# Patient Record
Sex: Female | Born: 1975 | Race: Black or African American | Hispanic: No | Smoking: Never smoker
Health system: Southern US, Community
[De-identification: ages and names within clinical notes are randomized; demographics above are authoritative.]

## PROBLEM LIST (undated history)

## (undated) DIAGNOSIS — M419 Scoliosis, unspecified: Secondary | ICD-10-CM

## (undated) DIAGNOSIS — M797 Fibromyalgia: Secondary | ICD-10-CM

## (undated) DIAGNOSIS — F32A Depression, unspecified: Secondary | ICD-10-CM

## (undated) DIAGNOSIS — M549 Dorsalgia, unspecified: Secondary | ICD-10-CM

## (undated) DIAGNOSIS — F419 Anxiety disorder, unspecified: Secondary | ICD-10-CM

## (undated) DIAGNOSIS — F329 Major depressive disorder, single episode, unspecified: Secondary | ICD-10-CM

## (undated) HISTORY — PX: ABDOMINAL HYSTERECTOMY: SHX81

## (undated) HISTORY — PX: ECTOPIC PREGNANCY SURGERY: SHX613

---

## 2013-08-07 ENCOUNTER — Encounter (HOSPITAL_COMMUNITY): Payer: Self-pay | Admitting: Emergency Medicine

## 2013-08-07 ENCOUNTER — Emergency Department (HOSPITAL_COMMUNITY)
Admission: EM | Admit: 2013-08-07 | Discharge: 2013-08-07 | Disposition: A | Discharge status: Home / Self Care | Payer: Medicaid - Out of State | Attending: Emergency Medicine | Admitting: Emergency Medicine

## 2013-08-07 DIAGNOSIS — H6690 Otitis media, unspecified, unspecified ear: Secondary | ICD-10-CM | POA: Insufficient documentation

## 2013-08-07 DIAGNOSIS — R51 Headache: Secondary | ICD-10-CM | POA: Insufficient documentation

## 2013-08-07 DIAGNOSIS — F419 Anxiety disorder, unspecified: Secondary | ICD-10-CM

## 2013-08-07 DIAGNOSIS — Z8739 Personal history of other diseases of the musculoskeletal system and connective tissue: Secondary | ICD-10-CM | POA: Insufficient documentation

## 2013-08-07 DIAGNOSIS — F411 Generalized anxiety disorder: Secondary | ICD-10-CM | POA: Insufficient documentation

## 2013-08-07 HISTORY — DX: Fibromyalgia: M79.7

## 2013-08-07 HISTORY — DX: Scoliosis, unspecified: M41.9

## 2013-08-07 MED ORDER — LORAZEPAM 1 MG PO TABS: 1.0000 mg | ORAL_TABLET | Freq: Three times a day (TID) | ORAL | Status: DC | PRN

## 2013-08-07 MED ORDER — AMOXICILLIN 500 MG PO CAPS: 500.0000 mg | ORAL_CAPSULE | Freq: Three times a day (TID) | ORAL | Status: DC

## 2013-08-07 MED ORDER — TRAMADOL HCL 50 MG PO TABS: 50.0000 mg | ORAL_TABLET | Freq: Four times a day (QID) | ORAL | Status: DC | PRN

## 2013-08-07 NOTE — Discharge Instructions (Signed)
Take amoxicillin as directed until gone. Take Tramadol as needed for pain. Take Ativan as needed for anxiety. Follow up with a primary care provider from the resource guide below.    Emergency Department Resource Guide 1) Find a Doctor and Pay Out of Pocket Although you won't have to find out who is covered by your insurance plan, it is a good idea to ask around and get recommendations. You will then need to call the office and see if the doctor you have chosen will accept you as a new patient and what types of options they offer for patients who are self-pay. Some doctors offer discounts or will set up payment plans for their patients who do not have insurance, but you will need to ask so you aren't surprised when you get to your appointment.  2) Contact Your Local Health Department Not all health departments have doctors that can see patients for sick visits, but many do, so it is worth a call to see if yours does. If you don't know where your local health department is, you can check in your phone book. The CDC also has a tool to help you locate your state's health department, and many state websites also have listings of all of their local health departments.  3) Find a Walk-in Clinic If your illness is not likely to be very severe or complicated, you may want to try a walk in clinic. These are popping up all over the country in pharmacies, drugstores, and shopping centers. They're usually staffed by nurse practitioners or physician assistants that have been trained to treat common illnesses and complaints. They're usually fairly quick and inexpensive. However, if you have serious medical issues or chronic medical problems, these are probably not your best option.  No Primary Care Doctor: - Call Health Connect at  [phone] - they can help you locate a primary care doctor that  accepts your insurance, provides certain services, etc. - Physician Referral Service- [phone]  Chronic Pain  Problems: Organization         Address  Phone   Notes  [facility]  [phone] Patients need to be referred by their primary care doctor.   Medication Assistance: Organization         Address  Phone   Notes  [facility] [address] [place], [place] [address] [phone]) [phone] --Must be a resident of [facility] [place] -- Must have NO insurance coverage whatsoever (no Medicaid/ Medicare, etc.) -- The pt. MUST have a primary care doctor that directs their care regularly and follows them in the community   MedAssist  [phone]) [phone]   [employer]  [phone]    Agencies that provide inexpensive medical care: Organization         Address  Phone   Notes  [facility]  [phone]   [facility]    [phone]   [facility] [address] [place], [place] [address] [phone]   [facility] [address], [place] [phone]   [facility]anned Parenthood    [phone]   [facility]    [phone]   [facility]  [address], [place] Phone:  [phone], Fax:  [phone] Hours of Operation:  9 am - 6 pm, M-F.  Also accepts Medicaid/Medicare and self-pay.  [facility]  [address], [place] Phone: [phone], Fax: [phone]. Hours of Operation:  8:30 am - 5:30 pm, M-F.  Also accepts Medicaid and self-pay.  °[facility] [place] [address], [place] Phone: [phone]   °[facility] [address], [place], [place] [phone], Ext. 123 Mondays & Thursdays: 7-9 AM.  First 15 patients are seen on a first come, first serve basis. °  ° °Medicaid-accepting [place] Providers: ° °Organization         Address  Phone   Notes  °[facility] [address], [place] [phone] Also  accepts self-pay patients.  °[facility] [address], [place] ° [phone]) [phone]   °[facility] [address], [place] [phone]) [phone]   °[facility] [address] [place] [address], [place] [phone]) [phone]   °Veita Bland [address], [place]  ° [phone]) [phone] Only accepts [place] Access Medicaid patients after they have their name applied to their card.  ° °Self-Pay (no insurance) in [place]: ° °Organization         Address  Phone   Notes  °Sickle Cell Patients, [facility] [address], [place] [phone]   °[facility] [address], [place] [phone]) [phone]   °[facility] ° [address], [place] [phone]) [phone]   °[facility]/Dr. Osei-Bonsu ° [address] [place] [address], [place] or [address], [place] [phone] Phone number for both [place] and [place] locations is the same.  °[facility] [address], [place] [phone]) [phone]   °[facility] [address] [place] [address], [place] or [address] [phone]) [phone] °([phone]) [phone]   °[facility] [address], [place] ([phone]) [phone], phone; [phone], fax Sees patients 1st and 3rd Saturday of every month.  Must not qualify for public or private insurance (i.e. Medicaid, Medicare, [place] Health Choice, Veterans' Benefits) • Household income should be no more than 200% of the poverty level •The clinic cannot treat you if you are pregnant or think you are pregnant • Sexually transmitted diseases are not treated at the clinic.  ° ° °Dental Care: °Organization         Address  Phone  Notes  °[place] [facility] [address], [place] [phone]) [phone] Accepts children up to age 21 who are enrolled in Medicaid or [place] Health Choice; pregnant  women with a Medicaid card; and children who have applied for Medicaid or [place] Health Choice, but were declined, whose parents can pay a reduced fee at time of service.  °[place] [facility] [place]  [address], [place] [phone]) [phone] Accepts children up to age 21 who are enrolled in Medicaid or [place] Health Choice; pregnant women with a Medicaid card; and children who have applied for Medicaid or [place] Health Choice, but were declined, whose parents can pay a reduced fee at time of service.  °[facility] ° [address], [place] [phone]) [phone] Patients are seen by appointment only. Walk-ins are not accepted. [facility] will see patients 18 years of age and older. °Monday - Tuesday (8am-5pm) °Most Wednesdays (8:30-5pm) °$30 per visit, cash only  °[facility]   [facility]  [address], [facility] [phone] Patients are seen by appointment only. Walk-ins are not accepted. [place] will see patients 37 years of age and older. One Wednesday Evening (Monthly: Volunteer Based).  $30 per visit, cash only  [place]  [phone] for adults; Children under age 77, call Graduate Pediatric Dentistry at [phone]. Children aged 79-14, please call [phone] to request a pediatric application.  Dental services are provided in all areas of dental care including fillings, crowns and bridges, complete and partial dentures, implants, gum treatment, root canals, and extractions. Preventive care is also provided. Treatment is provided to both adults and children. Patients are selected via a lottery and there is often a waiting list.   [facility] [address], [place]  [phone] [email or web address]   [facility] [address] [place], [place] [phone], Ext. 123 Second and Fourth Thursday of each month, opens at 6:30 AM; Clinic ends at 9 AM.  Patients are  seen on a first-come first-served basis, and a limited number are seen during each clinic.   [facility]  [address] [place]ard D[place]er [place], [place] [phone]   Eligibility Requirements You must have lived in [place], [place], or [place] counties for at least the last three months.   You cannot be eligible for state or federal sponsored [employer], including [employer], [place], or [employer].   You generally cannot be eligible for healthcare insurance through your employer.    How to apply: Eligibility screenings are held every Tuesday and Wednesday afternoon from 1:00 pm until 4:00 pm. You do not need an appointment for the interview!  [facility] [address], [place], [place]   [place]  [place] Department  [place]  [phone]    Behavioral Health Resources in the Community: Intensive Outpatient Programs Organization         Address  Phone  Notes  [place]. [address] [place], [place] [phone]   [facility] [address], [place], [place]   [facility] [address] [place], [place]   [place] [address],  [place], [place] or [phone]   Substance Abuse Resources Organization         Address  Phone  Notes  [facility]  [phone]   [employer]  [phone]) [phone]   [facility] [place]   Chinita Pester  [phone]   Residential & Outpatient Substance Abuse Program  [phone]   Psychological Services Organization         Address  Phone  Notes  [facility] [place]  [place]  [phone]   [place] [address] [place] [phone] or [phone]    Mobile Crisis  Teams Organization         Address  Phone  Notes  [facility], Mobile Crisis Care Unit  [phone]   [facility]  [address] [place], [place]   [place] [address], [place] [phone]    Self-Help/Support Groups Organization         Address  Phone             Notes  Mental Health  [place] - variety of support groups  336- H3156881 Call for more information  Narcotics Anonymous (NA), [facility] [address], [employer] [place]  2 meetings at this location   Special educational needs teacher         Address  Phone  Notes  [facility] [place],    [place]  [phone])[phone]   [facility]  [address], [place] [address], [place], [place]   [place], [place] [phone] Admissions: 8am-3pm M-F  Incentives Substance [place] [address],    [place], [place] [phone]   [facility] [address], [place], [place]   [facility] [address],  [place], [place]   [facility] - Intensive Outpatient [address], [place], [place]   [facility]  [place], [place] [phone]-(820)[phone] or [phone]   [facility] ([facility]) [address] [place], [place] Accepts Medicaid  [facility] [address],  [place] [phone] Substance Abuse/Addiction Treatment   [facility] Organization         Address  Phone  Notes  [facility]  [phone]   Domenic Schwab, PhD [address] [place], [place]   [phone] or 7720305616   [place], [place] [phone]   [facility] [address], [place], [place] [phone]  Insurance/Medicaid/sponsorship through [facility] Families [address], [address] [place]                                    [place], [place] [phone] [employer] [address], [place] [phone]    Dr. Adele Schilder  [phone]   [facility]. 1) [address] [place] 2) [place] 3)  [place] [address], [place] [phone]  [phone]   [place] [phone] or [phone] (After Hours)

## 2013-08-07 NOTE — ED Notes (Signed)
Pt is here with left ear pain and drainage

## 2013-08-07 NOTE — ED Provider Notes (Signed)
CSN: [id number]     Arrival date & time 08/07/13  1007 History  This chart was scribed for non-physician practitioner, Emilia BeckKaitlyn Melana Hingle, PA-C working with Gavin PoundMichael Y. Oletta LamasGhim, MD by Greggory StallionKayla Andersen, ED scribe. This patient was seen in room TR05C/TR05C and the patient's care was started at 11:32 AM.    Chief Complaint  Patient presents with  . Otalgia   The history is provided by the patient. No language interpreter was used.   HPI Comments: Katelyn Wade is a 38 y.o. female who presents to the Emergency Department complaining of gradual onset, constant, aching left ear pain that started about one week ago. She states the pain has caused her to have a headache and she has subjective fever. Pt has put peroxide and olive oil in her ear with temporary relief. Denies ear drainage, sore throat. [place] from [place] and is having increased anxiety due to social stressors. She is requesting something for anxiety.   Past Medical History  Diagnosis Date  . Scoliosis   . Fibromyalgia    Past Surgical History  Procedure Laterality Date  . Abdominal hysterectomy     No family history on file. History  Substance Use Topics  . Smoking status: Never Smoker   . Smokeless tobacco: Not on file  . Alcohol Use: No   OB History   Grav Para Term Preterm Abortions TAB SAB Ect Mult Living                 Review of Systems  Constitutional: Positive for fever.  HENT: Positive for ear pain. Negative for ear discharge and sore throat.   Neurological: Positive for headaches.  All other systems reviewed and are negative.   Allergies  Review of patient's allergies indicates no known allergies.  Home Medications  No current outpatient prescriptions on file.  BP 120/57  Pulse 65  Temp(Src) 98.6 F (37 C) (Oral)  Resp 18  SpO2 98%  Physical Exam  Nursing note and vitals reviewed. Constitutional: She is oriented to person, place, and time. She appears well-developed and well-nourished.  No distress.  HENT:  Head: Normocephalic and atraumatic.  Right Ear: Tympanic membrane, external ear and ear canal normal.  Left Ear: Tympanic membrane, external ear and ear canal normal.  Left mastoid tenderness to palpation.   Eyes: EOM are normal.  Neck: Neck supple. No tracheal deviation present.  Cardiovascular: Normal rate.   Pulmonary/Chest: Effort normal. No respiratory distress.  Musculoskeletal: Normal range of motion.  Neurological: She is alert and oriented to person, place, and time.  Skin: Skin is warm and dry.  Psychiatric: She has a normal mood and affect. Her behavior is normal.    ED Course  Procedures (including critical care time)  DIAGNOSTIC STUDIES: Oxygen Saturation is 98% on RA, normal by my interpretation.    COORDINATION OF CARE: 11:35 AM-Discussed treatment plan which includes amoxicillin and PCP resources with pt at bedside and pt agreed to plan.   Labs Review Labs Reviewed - No data to display Imaging Review No results found.  EKG Interpretation   None       MDM   Final diagnoses:  Otitis media, acute  Anxiety    Patient likely has otitis media and will be treated with amoxicillin. Patient will have tramadol for pain. Patient will have ativan for anxiety. Vitals stable and patient afebrile.   I personally performed the services described in this documentation, which was scribed in my presence. The recorded information has  been reviewed and is accurate.   Emilia Beck, [place] 08/07/13 [phone]

## 2013-08-07 NOTE — ED Notes (Signed)
Left ear pain x 2 days. States is starting to hurt down her neck.

## 2013-08-16 NOTE — ED Provider Notes (Signed)
Medical screening examination/treatment/procedure(s) were performed by non-physician practitioner and as supervising physician I was immediately available for consultation/collaboration.   Merissa Renwick Y. Zacherie Honeyman, MD 08/16/13 0908 

## 2013-09-19 ENCOUNTER — Encounter (HOSPITAL_COMMUNITY): Payer: Self-pay | Admitting: Emergency Medicine

## 2013-09-19 ENCOUNTER — Emergency Department (INDEPENDENT_AMBULATORY_CARE_PROVIDER_SITE_OTHER)
Admission: EM | Admit: 2013-09-19 | Discharge: 2013-09-19 | Disposition: A | Discharge status: Home / Self Care | Payer: Medicaid Other | Source: Home / Self Care | Attending: Emergency Medicine | Admitting: Emergency Medicine

## 2013-09-19 DIAGNOSIS — L259 Unspecified contact dermatitis, unspecified cause: Secondary | ICD-10-CM

## 2013-09-19 DIAGNOSIS — L309 Dermatitis, unspecified: Secondary | ICD-10-CM

## 2013-09-19 HISTORY — DX: Dorsalgia, unspecified: M54.9

## 2013-09-19 HISTORY — DX: Anxiety disorder, unspecified: F41.9

## 2013-09-19 HISTORY — DX: Major depressive disorder, single episode, unspecified: F32.9

## 2013-09-19 HISTORY — DX: Depression, unspecified: F32.A

## 2013-09-19 LAB — HIV ANTIBODY (ROUTINE TESTING W REFLEX): HIV: NONREACTIVE

## 2013-09-19 LAB — RPR: RPR Ser Ql: NONREACTIVE

## 2013-09-19 MED ORDER — PREDNISONE 20 MG PO TABS: ORAL_TABLET | ORAL | Status: DC
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
## 2013-09-19 NOTE — Discharge Instructions (Signed)
Differential diagnosis: erythema multiforme, psoriasis, pityriasis rosea, syphilis.  Only a skin biopsy can tell the difference, so important to see a dermatologist.  Use mild soap, use warm water to shower, take brief shower, apply moisturizer to skin after showering.

## 2013-09-19 NOTE — ED Notes (Signed)
Patient has a rash covering trunk and extremities.  Flat, dry, red areas of different diameters, areas itch.

## 2013-09-20 ENCOUNTER — Telehealth (HOSPITAL_COMMUNITY): Payer: Self-pay | Admitting: *Deleted

## 2013-09-20 NOTE — ED Notes (Signed)
Pt. called in for her lab results. Pt. verified x 2 and told that HIV and RPR were non-reactive.  Pt. instructed if she had an exposure to get her HIV rechecked in 6 mos.  She asked me, what is an exposure? I told her it means she had unprotected sex with someone.  Pt. voiced understanding. Vassie [place], Demarri Elie M 09/20/2013

## 2013-09-21 NOTE — Progress Notes (Signed)
Quick Note:  Test result was normal. No further action is needed at this time. The patient was called and informed of these normal results. ______

## 2013-10-11 ENCOUNTER — Ambulatory Visit: Payer: Medicaid Other | Attending: Internal Medicine | Admitting: Internal Medicine

## 2013-10-11 VITALS — BP 128/98 | HR 98 | Temp 98.0°F | Resp 16 | Ht 63.0 in | Wt 175.0 lb

## 2013-10-11 DIAGNOSIS — M549 Dorsalgia, unspecified: Secondary | ICD-10-CM | POA: Insufficient documentation

## 2013-10-11 DIAGNOSIS — G894 Chronic pain syndrome: Secondary | ICD-10-CM

## 2013-10-11 DIAGNOSIS — M542 Cervicalgia: Secondary | ICD-10-CM | POA: Insufficient documentation

## 2013-10-11 DIAGNOSIS — G8929 Other chronic pain: Secondary | ICD-10-CM | POA: Insufficient documentation

## 2013-10-11 MED ORDER — OXYCODONE HCL 15 MG PO TABS: 15.0000 mg | ORAL_TABLET | Freq: Four times a day (QID) | ORAL | Status: DC | PRN

## 2013-10-11 MED ORDER — ALPRAZOLAM 2 MG PO TABS: 2.0000 mg | ORAL_TABLET | Freq: Four times a day (QID) | ORAL | Status: DC | PRN

## 2013-10-11 NOTE — Progress Notes (Signed)
Pt is here to establish care. Pt is requesting referrals to dermatology, pain management and psychology. Pt reports having scoliosis w/ extreme pain.
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
## 2013-10-18 ENCOUNTER — Ambulatory Visit: Payer: Medicaid Other | Attending: Internal Medicine | Admitting: *Deleted

## 2013-10-18 DIAGNOSIS — F4323 Adjustment disorder with mixed anxiety and depressed mood: Secondary | ICD-10-CM
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
## 2013-10-23 ENCOUNTER — Encounter: Payer: Self-pay | Admitting: Internal Medicine

## 2013-10-23 ENCOUNTER — Ambulatory Visit: Payer: Medicaid Other | Attending: Internal Medicine | Admitting: Internal Medicine

## 2013-10-23 VITALS — BP 100/67 | HR 75 | Temp 98.7°F | Resp 16 | Ht 63.0 in | Wt 176.0 lb

## 2013-10-23 DIAGNOSIS — R21 Rash and other nonspecific skin eruption: Secondary | ICD-10-CM | POA: Diagnosis not present

## 2013-10-23 DIAGNOSIS — F329 Major depressive disorder, single episode, unspecified: Secondary | ICD-10-CM | POA: Insufficient documentation

## 2013-10-23 DIAGNOSIS — F3289 Other specified depressive episodes: Secondary | ICD-10-CM | POA: Insufficient documentation

## 2013-10-23 DIAGNOSIS — G4700 Insomnia, unspecified: Secondary | ICD-10-CM | POA: Insufficient documentation

## 2013-10-23 DIAGNOSIS — IMO0002 Reserved for concepts with insufficient information to code with codable children: Secondary | ICD-10-CM | POA: Insufficient documentation

## 2013-10-23 DIAGNOSIS — M2550 Pain in unspecified joint: Secondary | ICD-10-CM | POA: Insufficient documentation

## 2013-10-23 MED ORDER — TRIAMCINOLONE ACETONIDE 0.1 % EX CREA: 1.0000 "application " | TOPICAL_CREAM | Freq: Two times a day (BID) | CUTANEOUS | Status: DC

## 2013-10-23 MED ORDER — HYDROXYZINE PAMOATE 25 MG PO CAPS: 25.0000 mg | ORAL_CAPSULE | Freq: Three times a day (TID) | ORAL | Status: DC | PRN
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
## 2013-10-23 NOTE — Progress Notes (Signed)
Pt comes in with ongoing rash noted all over body that started 09/05/13. Pt was seen at urgent care and prescribed Steroids. States not effective. Dry patches seen on chest,arms and thighs, itchiness and irritation worsening at night. Negative blood work up noted. Pt has never tried Benadryl for relief.

## 2013-10-24 ENCOUNTER — Telehealth: Payer: Self-pay | Admitting: Emergency Medicine

## 2013-10-24 LAB — ANA: ANA: NEGATIVE

## 2013-10-24 LAB — SEDIMENTATION RATE: Sed Rate: 6 mm/hr (ref 0–22)

## 2013-10-24 LAB — C-REACTIVE PROTEIN: CRP: <0.5 mg/dL (ref <0.60)

## 2013-10-24 LAB — RHEUMATOID FACTOR

## 2013-10-24 NOTE — Telephone Encounter (Signed)
Message copied by Darlis LoanSMITH, JILL D on Tue Oct 24, 2013  4:09 PM ------      Message from: [place] CommonsKECK, VALERIE A      Created: Tue Oct 24, 2013  2:29 PM       Please call patient and let her know everything came back normal. It is not a autoimmune disorder. Thanks ------

## 2013-10-24 NOTE — Telephone Encounter (Signed)
Pt given lab results 

## 2013-11-01 ENCOUNTER — Ambulatory Visit (HOSPITAL_BASED_OUTPATIENT_CLINIC_OR_DEPARTMENT_OTHER): Payer: Medicaid Other | Admitting: Internal Medicine

## 2013-11-01 ENCOUNTER — Ambulatory Visit: Payer: Medicaid Other | Attending: Internal Medicine | Admitting: *Deleted

## 2013-11-01 ENCOUNTER — Encounter: Payer: Self-pay | Admitting: Internal Medicine

## 2013-11-01 VITALS — BP 100/66 | HR 70 | Temp 98.1°F | Resp 14 | Ht 63.0 in | Wt 178.6 lb

## 2013-11-01 DIAGNOSIS — R21 Rash and other nonspecific skin eruption: Secondary | ICD-10-CM | POA: Insufficient documentation

## 2013-11-01 DIAGNOSIS — Z833 Family history of diabetes mellitus: Secondary | ICD-10-CM | POA: Diagnosis not present

## 2013-11-01 DIAGNOSIS — L539 Erythematous condition, unspecified: Secondary | ICD-10-CM | POA: Insufficient documentation

## 2013-11-01 DIAGNOSIS — F4323 Adjustment disorder with mixed anxiety and depressed mood: Secondary | ICD-10-CM

## 2013-11-01 DIAGNOSIS — F3289 Other specified depressive episodes: Secondary | ICD-10-CM | POA: Insufficient documentation

## 2013-11-01 DIAGNOSIS — L299 Pruritus, unspecified: Secondary | ICD-10-CM | POA: Insufficient documentation

## 2013-11-01 DIAGNOSIS — Z0000 Encounter for general adult medical examination without abnormal findings: Secondary | ICD-10-CM | POA: Diagnosis not present

## 2013-11-01 DIAGNOSIS — F411 Generalized anxiety disorder: Secondary | ICD-10-CM | POA: Insufficient documentation

## 2013-11-01 DIAGNOSIS — F329 Major depressive disorder, single episode, unspecified: Secondary | ICD-10-CM | POA: Insufficient documentation

## 2013-11-01 DIAGNOSIS — Z79899 Other long term (current) drug therapy: Secondary | ICD-10-CM | POA: Insufficient documentation

## 2013-11-01 LAB — CBC WITH DIFFERENTIAL/PLATELET
Basophils Absolute: 0 10*3/uL (ref 0.0–0.1)
Basophils Relative: 0 % (ref 0–1)
EOS PCT: 0 % (ref 0–5)
Eosinophils Absolute: 0 10*3/uL (ref 0.0–0.7)
HEMATOCRIT: 42 % (ref 36.0–46.0)
Hemoglobin: 14.3 g/dL (ref 12.0–15.0)
Lymphocytes Relative: 27 % (ref 12–46)
Lymphs Abs: 2.4 10*3/uL (ref 0.7–4.0)
MCH: 30.8 pg (ref 26.0–34.0)
MCHC: 34 g/dL (ref 30.0–36.0)
MCV: 90.3 fL (ref 78.0–100.0)
MONO ABS: 0.4 10*3/uL (ref 0.1–1.0)
Monocytes Relative: 5 % (ref 3–12)
Neutro Abs: 6.1 10*3/uL (ref 1.7–7.7)
Neutrophils Relative %: 68 % (ref 43–77)
PLATELETS: 413 10*3/uL — AB (ref 150–400)
RBC: 4.65 MIL/uL (ref 3.87–5.11)
RDW: 13.4 % (ref 11.5–15.5)
WBC: 8.9 10*3/uL (ref 4.0–10.5)

## 2013-11-01 LAB — POCT GLYCOSYLATED HEMOGLOBIN (HGB A1C): Hemoglobin A1C: 5.2

## 2013-11-01 NOTE — Patient Instructions (Addendum)
No other products on skin for 1 week. Continue to use Kenalog cream

## 2013-11-01 NOTE — Progress Notes (Signed)
Patient is here for F/U from generalized rash. States she has been using 6-8 drops of hydrogen peroxide in water and drinking it. States itching is still present but better. States she has an appt today with Child psychotherapistsocial worker for depression.

## 2013-11-01 NOTE — Progress Notes (Signed)
Patient requested assessment of SA for required documentation for Pain Clinic Referral.  LCSW will follow up with patient for assessment.  Beverly Sessionsywan J Niara Bunker MSW, LCSW Duration 15 minutes

## 2013-11-01 NOTE — Progress Notes (Signed)
Patient ID: Katelyn Wade, female   DOB: 01/18/1976, 38 y.o.   MRN: [id number]  CC: One-week followup for rash  HPI: Patient presents to clinic today for a one-week followup of rash. Patient reports the rash has significantly improved since last visit. Patient reports she is still using her grapeseed oil and other natural Creams on skin to prevent dry skin. Patient reports significant improvement of itching.    No Known Allergies Past Medical History  Diagnosis Date  . Scoliosis   . Fibromyalgia   . Back pain   . Depression   . Anxiety    Current Outpatient Prescriptions on File Prior to Visit  Medication Sig Dispense Refill  . alprazolam (XANAX) 2 MG tablet Take 1 tablet (2 mg total) by mouth 4 (four) times daily as needed for anxiety.  90 tablet  1  . carisoprodol (SOMA) 350 MG tablet Take 350 mg by mouth 4 (four) times daily as needed for muscle spasms.      . DULoxetine (CYMBALTA) 20 MG capsule Take 20 mg by mouth daily.      . hydrOXYzine (VISTARIL) 25 MG capsule Take 1 capsule (25 mg total) by mouth every 8 (eight) hours as needed for itching.  30 capsule  1  . oxyCODONE (ROXICODONE) 15 MG immediate release tablet Take 1 tablet (15 mg total) by mouth every 6 (six) hours as needed for pain.  90 tablet  0  . traMADol (ULTRAM) 50 MG tablet Take 1 tablet (50 mg total) by mouth every 6 (six) hours as needed.  15 tablet  0  . triamcinolone cream (KENALOG) 0.1 % Apply 1 application topically 2 (two) times daily. Do not apply to face  45 g  1  . amoxicillin (AMOXIL) 500 MG capsule Take 1 capsule (500 mg total) by mouth 3 (three) times daily.  21 capsule  0  . predniSONE (DELTASONE) 20 MG tablet Take 3 daily for 5 days, 2 daily for 5 days, 1 daily for 5 days.  30 tablet  0   No current facility-administered medications on file prior to visit.   Family History  Problem Relation Age of Onset  . Bipolar disorder Mother   . Fibromyalgia Mother    History   Social History  . Marital  Status: Single    Spouse Name: N/A    Number of Children: N/A  . Years of Education: N/A   Occupational History  . Not on file.   Social History Main Topics  . Smoking status: Never Smoker   . Smokeless tobacco: Not on file  . Alcohol Use: No  . Drug Use: No  . Sexual Activity: Not on file   Other Topics Concern  . Not on file   Social History Narrative  . No narrative on file    Review of Systems  Constitutional: Negative for fever and chills.  HENT: Negative.   Respiratory: Negative.   Cardiovascular: Negative.   Skin: Positive for itching (improved ) and rash (cleared on chest, better on extremeties).  Neurological: Negative.      Objective:   Filed Vitals:   11/01/13 1443  BP: 100/66  Pulse: 70  Temp: 98.1 F (36.7 C)  Resp: 14   Physical Exam  Vitals reviewed. Constitutional: She appears well-developed.  Neck: Normal range of motion. Neck supple.  Cardiovascular: Normal rate, regular rhythm and normal heart sounds.   Pulmonary/Chest: Effort normal and breath sounds normal.  Lymphadenopathy:    She has no cervical adenopathy.  Skin: Skin is warm and dry. Rash (significantly improved since last visit. Less erythema, dried) noted.  Psychiatric: She has a normal mood and affect. Her behavior is normal.     No results found for this basename: WBC, HGB, HCT, MCV, PLT   No results found for this basename: CREATININE, BUN, NA, K, CL, CO2    Lab Results  Component Value Date   HGBA1C 5.2 11/01/2013   Lipid Panel  No results found for this basename: chol, trig, hdl, cholhdl, vldl, ldlcalc       Assessment and plan:   Katelyn Wade was seen today for follow-up and rash.  Diagnoses and associated orders for this visit:  Rash and nonspecific skin eruption Continue Kenalog and hydroxyzine for rash. Stressed the patient stop all other herbal products that she was using on her skin and only use Kenalog cream for the next week. Advised patient to stop  drinking hydrogen peroxide, this is not an approved the treatment. Explained the risks of drinking hydrogen peroxide. Gave patient information to dermatologist from referral Rash is completely gone from chest Family history of diabetes mellitus - HgB A1c Patient insists that she is tested for all diseases.  Preventative health care - CBC with Differential - COMPLETE METABOLIC PANEL WITH GFR - TSH - Iron and TIBC - Vitamin D, 25-hydroxy  Return in about 4 weeks (around 11/29/2013) for rash. Plan the patient needs to make appointment with dermatology.    Katelyn CommonsValerie Keck, NP-C [facility] [phone] 11/01/2013, 4:24 PM

## 2013-11-02 ENCOUNTER — Other Ambulatory Visit: Payer: Self-pay

## 2013-11-02 ENCOUNTER — Other Ambulatory Visit: Payer: Self-pay | Admitting: Internal Medicine

## 2013-11-02 ENCOUNTER — Telehealth: Payer: Self-pay

## 2013-11-02 DIAGNOSIS — E559 Vitamin D deficiency, unspecified: Secondary | ICD-10-CM

## 2013-11-02 DIAGNOSIS — R7989 Other specified abnormal findings of blood chemistry: Secondary | ICD-10-CM

## 2013-11-02 LAB — COMPLETE METABOLIC PANEL WITH GFR
ALT: 22 U/L (ref 0–35)
AST: 23 U/L (ref 0–37)
Albumin: 4.3 g/dL (ref 3.5–5.2)
Alkaline Phosphatase: 60 U/L (ref 39–117)
BUN: 5 mg/dL — AB (ref 6–23)
CALCIUM: 9.7 mg/dL (ref 8.4–10.5)
CHLORIDE: 100 meq/L (ref 96–112)
CO2: 28 mEq/L (ref 19–32)
CREATININE: 0.7 mg/dL (ref 0.50–1.10)
GFR, Est Non African American: >89 mL/min
Glucose, Bld: 102 mg/dL — ABNORMAL HIGH (ref 70–99)
Potassium: 4.4 mEq/L (ref 3.5–5.3)
Sodium: 137 mEq/L (ref 135–145)
Total Bilirubin: 0.6 mg/dL (ref 0.2–1.2)
Total Protein: 7.1 g/dL (ref 6.0–8.3)

## 2013-11-02 LAB — IRON AND TIBC
%SAT: 29 % (ref 20–55)
Iron: 125 ug/dL (ref 42–145)
TIBC: 430 ug/dL (ref 250–470)
UIBC: 305 ug/dL (ref 125–400)

## 2013-11-02 LAB — TSH: TSH: 4.802 u[IU]/mL — AB (ref 0.350–4.500)

## 2013-11-02 LAB — VITAMIN D 25 HYDROXY (VIT D DEFICIENCY, FRACTURES): VIT D 25 HYDROXY: 14 ng/mL — AB (ref 30–89)

## 2013-11-02 MED ORDER — ERGOCALCIFEROL 1.25 MG (50000 UT) PO CAPS: 50000.0000 [IU] | ORAL_CAPSULE | ORAL | Status: DC

## 2013-11-02 NOTE — Telephone Encounter (Signed)
Message copied by Lestine MountJUAREZ, Kristen Bushway L on Thu Nov 02, 2013  4:55 PM ------      Message from: [place] CommonsKECK, VALERIE A      Created: Thu Nov 02, 2013 11:22 AM       Let her know her thyroid function was slightly elevated and I want her to come back for a free t4 lab. She will also need to take Vit d pills once weekly for 8 weeks. It has been sent to our pharmacy. Let her know this may improve her energy as well. ------

## 2013-11-06 ENCOUNTER — Encounter: Payer: Self-pay | Admitting: Internal Medicine

## 2013-11-06 ENCOUNTER — Ambulatory Visit: Payer: Medicaid Other | Attending: Internal Medicine

## 2013-11-06 ENCOUNTER — Encounter (HOSPITAL_BASED_OUTPATIENT_CLINIC_OR_DEPARTMENT_OTHER): Payer: Medicaid Other | Admitting: *Deleted

## 2013-11-06 DIAGNOSIS — Z7189 Other specified counseling: Secondary | ICD-10-CM

## 2013-11-06 DIAGNOSIS — R7989 Other specified abnormal findings of blood chemistry: Secondary | ICD-10-CM

## 2013-11-06 DIAGNOSIS — IMO0002 Reserved for concepts with insufficient information to code with codable children: Secondary | ICD-10-CM

## 2013-11-06 NOTE — Progress Notes (Signed)
Patient requested Substance Abuse Assessment for pain clinic referral.  LCSW completed NIDA screen in order to identify substance abuse challenges.  Patient identified that there had been no substance use in several years.  Patient states that she uses her prescription medications as prescribed.  LCSW will share this information with the requesting agency as directed.  Beverly Sessionsywan J Clem Wisenbaker MSW, LCSW Duration 60 minutes

## 2013-11-07 ENCOUNTER — Other Ambulatory Visit: Payer: Medicaid - Out of State

## 2013-11-07 LAB — T4, FREE: Free T4: 0.96 ng/dL (ref 0.80–1.80)

## 2013-11-08 ENCOUNTER — Telehealth: Payer: Self-pay

## 2013-11-08 NOTE — Telephone Encounter (Signed)
Patient notified of free T4 results. Patient informed if she is symptomatic free T4 can be repeated in 3 months. Patient appreciative of information.

## 2013-11-08 NOTE — Telephone Encounter (Signed)
Message copied by Marykay LexBECK, Randle Shatzer K on Wed Nov 08, 2013  4:48 PM ------      Message from: Holland CommonsKECK, VALERIE A      Created: Wed Nov 08, 2013  3:40 PM       Please call patient and let her know her free t4 was normal and we will not need to do anything further. She can have her TSH. Free t4 repeated in 3 months if symptomatic. ------

## 2013-11-09 ENCOUNTER — Other Ambulatory Visit: Payer: Medicaid - Out of State

## 2013-11-14 ENCOUNTER — Ambulatory Visit: Payer: Medicaid Other | Attending: Internal Medicine | Admitting: *Deleted

## 2013-11-14 DIAGNOSIS — F4323 Adjustment disorder with mixed anxiety and depressed mood: Secondary | ICD-10-CM | POA: Diagnosis not present
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
## 2013-11-21 ENCOUNTER — Ambulatory Visit: Payer: Medicaid Other

## 2013-11-23 ENCOUNTER — Ambulatory Visit: Payer: Medicaid Other | Attending: Internal Medicine | Admitting: *Deleted

## 2013-11-23 DIAGNOSIS — F4323 Adjustment disorder with mixed anxiety and depressed mood: Secondary | ICD-10-CM

## 2013-11-23 NOTE — Progress Notes (Signed)
LCSW met with patient in order to continue working on depression and anxiety symptoms.  Patient identified that the past week her mood has been very low.  LCSW processed with patient that challenges that have been connected to this.  LCSW identified with patient the need to focus on self care and personal direction.  LCSW also worked with patient on depressive symptoms by increasing motivation and encouraging patient to compartmentalize goals and plans in order to prevent being overwhelmed.  LCSW will see patient in 1-2 weeks.  Christene Lye MSW, LCSW Duration 75 minutes

## 2013-11-24 ENCOUNTER — Other Ambulatory Visit: Payer: Self-pay | Admitting: Internal Medicine

## 2013-11-24 DIAGNOSIS — G894 Chronic pain syndrome: Secondary | ICD-10-CM

## 2013-11-29 ENCOUNTER — Ambulatory Visit: Payer: Medicaid - Out of State | Admitting: Internal Medicine

## 2013-11-30 ENCOUNTER — Other Ambulatory Visit: Payer: Medicaid Other

## 2013-12-04 ENCOUNTER — Other Ambulatory Visit: Payer: Medicaid Other

## 2013-12-07 ENCOUNTER — Ambulatory Visit (HOSPITAL_BASED_OUTPATIENT_CLINIC_OR_DEPARTMENT_OTHER): Payer: Medicaid Other | Admitting: *Deleted

## 2013-12-07 ENCOUNTER — Ambulatory Visit: Payer: Medicaid Other | Attending: Internal Medicine | Admitting: Internal Medicine

## 2013-12-07 ENCOUNTER — Encounter: Payer: Self-pay | Admitting: Internal Medicine

## 2013-12-07 ENCOUNTER — Other Ambulatory Visit: Payer: Medicaid Other

## 2013-12-07 VITALS — BP 99/64 | HR 81 | Temp 98.4°F | Resp 16 | Ht 63.0 in | Wt 179.4 lb

## 2013-12-07 DIAGNOSIS — R21 Rash and other nonspecific skin eruption: Secondary | ICD-10-CM | POA: Diagnosis present

## 2013-12-07 DIAGNOSIS — N6459 Other signs and symptoms in breast: Secondary | ICD-10-CM | POA: Diagnosis not present

## 2013-12-07 DIAGNOSIS — L42 Pityriasis rosea: Secondary | ICD-10-CM | POA: Insufficient documentation

## 2013-12-07 DIAGNOSIS — N63 Unspecified lump in unspecified breast: Secondary | ICD-10-CM

## 2013-12-07 DIAGNOSIS — F411 Generalized anxiety disorder: Secondary | ICD-10-CM | POA: Diagnosis not present

## 2013-12-07 DIAGNOSIS — N6321 Unspecified lump in the left breast, upper outer quadrant: Secondary | ICD-10-CM

## 2013-12-07 DIAGNOSIS — M4120 Other idiopathic scoliosis, site unspecified: Secondary | ICD-10-CM | POA: Diagnosis not present

## 2013-12-07 DIAGNOSIS — F4323 Adjustment disorder with mixed anxiety and depressed mood: Secondary | ICD-10-CM

## 2013-12-07 DIAGNOSIS — F329 Major depressive disorder, single episode, unspecified: Secondary | ICD-10-CM | POA: Diagnosis not present

## 2013-12-07 DIAGNOSIS — F3289 Other specified depressive episodes: Secondary | ICD-10-CM | POA: Diagnosis not present

## 2013-12-07 NOTE — Progress Notes (Signed)
Patient here to follow up on rash. Patient would like a referral for mammogram for discomfort in left breast.

## 2013-12-07 NOTE — Patient Instructions (Signed)
Pityriasis Rosea  Pityriasis rosea is a rash which is probably caused by a virus. It generally starts as a scaly, red patch on the trunk (the area of the body that a t-shirt would cover) but does not appear on sun exposed areas. The rash is usually preceded by an initial larger spot called the "herald patch" a week or more before the rest of the rash appears. Generally within one to two days the rash appears rapidly on the trunk, upper arms, and sometimes the upper legs. The rash usually appears as flat, oval patches of scaly pink color. The rash can also be raised and one is able to feel it with a finger. The rash can also be finely crinkled and may slough off leaving a ring of scale around the spot. Sometimes a mild sore throat is present with the rash. It usually affects children and young adults in the spring and autumn. Women are more frequently affected than men.  TREATMENT   Pityriasis rosea is a self-limited condition. This means it goes away within 4 to 8 weeks without treatment. The spots may persist for several months, especially in darker-colored skin after the rash has resolved and healed. Benadryl and steroid creams may be used if itching is a problem.  SEEK MEDICAL CARE IF:   · Your rash does not go away or persists longer than three months.  · You develop fever and joint pain.  · You develop severe headache and confusion.  · You develop breathing difficulty, vomiting and/or extreme weakness.  Document Released: 07/15/2001 Document Revised: 08/31/2011 Document Reviewed: 08/03/2008  ExitCare® Patient Information ©2015 [employer]. This information is not intended to replace advice given to you by your health care provider. Make sure you discuss any questions you have with your health care provider.

## 2013-12-07 NOTE — Progress Notes (Signed)
LCSW met with patient in order to continue processing challenges with managing emotions and relationships. Patietn identified progress with setting boundaries with family and relationships in the past two weeks.  Patient also identified that she has been able to develop new supports as she prepares for her next transition.  LCSW encouraged patient to continue processing goals and plans for ongoing progress.    Christene Lye MSW, LCSW Duration 60 minutes

## 2013-12-08 NOTE — Progress Notes (Signed)
Patient ID: Katelyn Wade, female   DOB: 04/27/1976, 38 y.o.   MRN: [id number]  CC: f/u rash, left breast tenderness  HPI:  Patient presents today for a follow up of a rash.  Patient reports that she was able to see a Dermatologist recently that diagnosed her rash as pityriasis rosea, and was told to continue using Kenalog cream.  Rash has resolved on BUE and trunk. Patient also c/o of bilateral breast tenderness with a lump found on self breast exam in left breast.  Patient would like her disability forms filled out while here.  Patient reports that she was able to visit with a psychiatrist and was diagnosed with PTSD, anxiety, and major depressive disorder.  She states that she is working on dealing with her stress and building self esteem.  Patient is also being followed by pain management for fibromyalgia.    No Known Allergies Past Medical History  Diagnosis Date  . Scoliosis   . Fibromyalgia   . Back pain   . Depression   . Anxiety    Current Outpatient Prescriptions on File Prior to Visit  Medication Sig Dispense Refill  . alprazolam (XANAX) 2 MG tablet Take 1 tablet (2 mg total) by mouth 4 (four) times daily as needed for anxiety.  90 tablet  1  . carisoprodol (SOMA) 350 MG tablet Take 350 mg by mouth 4 (four) times daily as needed for muscle spasms.      . DULoxetine (CYMBALTA) 20 MG capsule Take 20 mg by mouth daily.      . ergocalciferol (VITAMIN D2) 50000 UNITS capsule Take 1 capsule (50,000 Units total) by mouth once a week.  8 capsule  0  . hydrOXYzine (VISTARIL) 25 MG capsule Take 1 capsule (25 mg total) by mouth every 8 (eight) hours as needed for itching.  30 capsule  1  . oxyCODONE (ROXICODONE) 15 MG immediate release tablet Take 1 tablet (15 mg total) by mouth every 6 (six) hours as needed for pain.  90 tablet  0  . traMADol (ULTRAM) 50 MG tablet Take 1 tablet (50 mg total) by mouth every 6 (six) hours as needed.  15 tablet  0  . triamcinolone cream (KENALOG) 0.1 % Apply 1  application topically 2 (two) times daily. Do not apply to face  45 g  1  . amoxicillin (AMOXIL) 500 MG capsule Take 1 capsule (500 mg total) by mouth 3 (three) times daily.  21 capsule  0  . predniSONE (DELTASONE) 20 MG tablet Take 3 daily for 5 days, 2 daily for 5 days, 1 daily for 5 days.  30 tablet  0   No current facility-administered medications on file prior to visit.   Family History  Problem Relation Age of Onset  . Bipolar disorder Mother   . Fibromyalgia Mother    History   Social History  . Marital Status: Single    Spouse Name: N/A    Number of Children: N/A  . Years of Education: N/A   Occupational History  . Not on file.   Social History Main Topics  . Smoking status: Never Smoker   . Smokeless tobacco: Not on file  . Alcohol Use: No  . Drug Use: No  . Sexual Activity: Not on file   Other Topics Concern  . Not on file   Social History Narrative  . No narrative on file    Review of Systems: See HPI  Objective:   Filed Vitals:   12/07/13 1541  BP: 99/64  Pulse: 81  Temp: 98.4 F (36.9 C)  Resp: 16   Physical Exam  Constitutional: She is oriented to person, place, and time.  Neck: Normal range of motion. Neck supple.  Cardiovascular: Normal rate, normal heart sounds and intact distal pulses.   Pulmonary/Chest: Effort normal and breath sounds normal. Right breast exhibits tenderness. Right breast exhibits no inverted nipple, no mass, no nipple discharge and no skin change. Left breast exhibits mass and tenderness. Left breast exhibits no inverted nipple, no nipple discharge and no skin change.    Abdominal: Soft. Bowel sounds are normal.  Musculoskeletal: Normal range of motion.  Neurological: She is alert and oriented to person, place, and time.  Skin: Skin is warm and dry. Rash (BLE) noted.  Rash resolved on BUE and trunk.  Hyperpigmented areas still present on BLE-improving  Psychiatric: Thought content normal.     Lab Results  Component  Value Date   WBC 8.9 11/01/2013   HGB 14.3 11/01/2013   HCT 42.0 11/01/2013   MCV 90.3 11/01/2013   PLT 413* 11/01/2013   Lab Results  Component Value Date   CREATININE 0.70 11/01/2013   BUN 5* 11/01/2013   NA 137 11/01/2013   K 4.4 11/01/2013   CL 100 11/01/2013   CO2 28 11/01/2013    Lab Results  Component Value Date   HGBA1C 5.2 11/01/2013   Lipid Panel  No results found for this basename: chol, trig, hdl, cholhdl, vldl, ldlcalc       Assessment and plan:   Katelyn Wade was seen today for no specified reason.  Diagnoses and associated orders for this visit:  Breast lump on left side at 1 o'clock position - US Breast Bilateral; Future  Pityriasis rosea  Anxiety state, unspecified   Return if symptoms worsen or fail to improve.     Holland CommonsKECK, VALERIE, NP-C [facility] [phone] 12/11/2013, 2:50 PM

## 2013-12-13 ENCOUNTER — Ambulatory Visit: Payer: Medicaid Other

## 2013-12-14 ENCOUNTER — Other Ambulatory Visit: Payer: Medicaid Other

## 2013-12-25 ENCOUNTER — Ambulatory Visit: Payer: Medicaid Other | Admitting: Physical Therapy

## 2014-01-22 ENCOUNTER — Emergency Department (HOSPITAL_COMMUNITY): Payer: Medicaid Other

## 2014-01-22 ENCOUNTER — Emergency Department (HOSPITAL_COMMUNITY)
Admission: EM | Admit: 2014-01-22 | Discharge: 2014-01-23 | Disposition: A | Discharge status: Home / Self Care | Payer: Medicaid Other | Attending: Emergency Medicine | Admitting: Emergency Medicine

## 2014-01-22 ENCOUNTER — Encounter (HOSPITAL_COMMUNITY): Payer: Self-pay | Admitting: Emergency Medicine

## 2014-01-22 DIAGNOSIS — R319 Hematuria, unspecified: Secondary | ICD-10-CM | POA: Insufficient documentation

## 2014-01-22 DIAGNOSIS — G8929 Other chronic pain: Secondary | ICD-10-CM | POA: Insufficient documentation

## 2014-01-22 DIAGNOSIS — F3289 Other specified depressive episodes: Secondary | ICD-10-CM | POA: Insufficient documentation

## 2014-01-22 DIAGNOSIS — M549 Dorsalgia, unspecified: Secondary | ICD-10-CM | POA: Insufficient documentation

## 2014-01-22 DIAGNOSIS — IMO0001 Reserved for inherently not codable concepts without codable children: Secondary | ICD-10-CM | POA: Insufficient documentation

## 2014-01-22 DIAGNOSIS — F411 Generalized anxiety disorder: Secondary | ICD-10-CM | POA: Diagnosis not present

## 2014-01-22 DIAGNOSIS — F329 Major depressive disorder, single episode, unspecified: Secondary | ICD-10-CM | POA: Insufficient documentation

## 2014-01-22 DIAGNOSIS — R1031 Right lower quadrant pain: Secondary | ICD-10-CM | POA: Insufficient documentation

## 2014-01-22 DIAGNOSIS — M542 Cervicalgia: Secondary | ICD-10-CM | POA: Insufficient documentation

## 2014-01-22 DIAGNOSIS — G8911 Acute pain due to trauma: Secondary | ICD-10-CM | POA: Insufficient documentation

## 2014-01-22 DIAGNOSIS — Z79899 Other long term (current) drug therapy: Secondary | ICD-10-CM | POA: Insufficient documentation

## 2014-01-22 LAB — I-STAT CHEM 8, ED
BUN: 7 mg/dL (ref 6–23)
CREATININE: 0.7 mg/dL (ref 0.50–1.10)
Calcium, Ion: 1.21 mmol/L (ref 1.12–1.23)
Chloride: 102 mEq/L (ref 96–112)
Glucose, Bld: 99 mg/dL (ref 70–99)
HCT: 35 % — ABNORMAL LOW (ref 36.0–46.0)
Hemoglobin: 11.9 g/dL — ABNORMAL LOW (ref 12.0–15.0)
POTASSIUM: 3.7 meq/L (ref 3.7–5.3)
SODIUM: 140 meq/L (ref 137–147)
TCO2: 25 mmol/L (ref 0–100)

## 2014-01-22 LAB — CBC WITH DIFFERENTIAL/PLATELET
BASOS PCT: 0 % (ref 0–1)
Basophils Absolute: 0 10*3/uL (ref 0.0–0.1)
EOS ABS: 0.1 10*3/uL (ref 0.0–0.7)
EOS PCT: 2 % (ref 0–5)
HEMATOCRIT: 33.7 % — AB (ref 36.0–46.0)
HEMOGLOBIN: 11.4 g/dL — AB (ref 12.0–15.0)
LYMPHS ABS: 2.3 10*3/uL (ref 0.7–4.0)
Lymphocytes Relative: 39 % (ref 12–46)
MCH: 31.1 pg (ref 26.0–34.0)
MCHC: 33.8 g/dL (ref 30.0–36.0)
MCV: 92.1 fL (ref 78.0–100.0)
MONO ABS: 0.3 10*3/uL (ref 0.1–1.0)
MONOS PCT: 5 % (ref 3–12)
Neutro Abs: 3.2 10*3/uL (ref 1.7–7.7)
Neutrophils Relative %: 54 % (ref 43–77)
Platelets: 255 10*3/uL (ref 150–400)
RBC: 3.66 MIL/uL — AB (ref 3.87–5.11)
RDW: 13.3 % (ref 11.5–15.5)
WBC: 5.9 10*3/uL (ref 4.0–10.5)

## 2014-01-22 LAB — URINE MICROSCOPIC-ADD ON

## 2014-01-22 LAB — URINALYSIS, ROUTINE W REFLEX MICROSCOPIC
Bilirubin Urine: NEGATIVE
Glucose, UA: NEGATIVE mg/dL
HGB URINE DIPSTICK: NEGATIVE
Ketones, ur: NEGATIVE mg/dL
Leukocytes, UA: NEGATIVE
Nitrite: POSITIVE — AB
Protein, ur: NEGATIVE mg/dL
SPECIFIC GRAVITY, URINE: 1.005 (ref 1.005–1.030)
Urobilinogen, UA: 1 mg/dL (ref 0.0–1.0)
pH: 5.5 (ref 5.0–8.0)

## 2014-01-22 MED ORDER — HYDROMORPHONE HCL PF 1 MG/ML IJ SOLN
1.0000 mg | Freq: Once | INTRAMUSCULAR | Status: AC
  Administered 2014-01-22: 1 mg via INTRAVENOUS

## 2014-01-22 MED ORDER — HYDROMORPHONE HCL PF 1 MG/ML IJ SOLN
1.0000 mg | Freq: Once | INTRAMUSCULAR | Status: AC
  Administered 2014-01-22: 1 mg via INTRAVENOUS
  Filled 2014-01-22: qty 1

## 2014-01-22 MED ORDER — IOHEXOL 300 MG/ML  SOLN
100.0000 mL | Freq: Once | INTRAMUSCULAR | Status: AC | PRN
  Administered 2014-01-22: 100 mL via INTRAVENOUS

## 2014-01-22 MED ORDER — OXYCODONE-ACETAMINOPHEN 5-325 MG PO TABS
1.0000 | ORAL_TABLET | Freq: Once | ORAL | Status: AC
  Administered 2014-01-22: 1 via ORAL
  Filled 2014-01-22: qty 1

## 2014-01-22 MED ORDER — HYDROMORPHONE HCL PF 1 MG/ML IJ SOLN
2.0000 mg | Freq: Once | INTRAMUSCULAR | Status: DC
  Filled 2014-01-22: qty 2

## 2014-01-22 NOTE — ED Notes (Signed)
Patient transported to X-ray 
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
## 2014-01-25 ENCOUNTER — Ambulatory Visit (HOSPITAL_BASED_OUTPATIENT_CLINIC_OR_DEPARTMENT_OTHER): Payer: No Typology Code available for payment source | Admitting: *Deleted

## 2014-01-25 ENCOUNTER — Ambulatory Visit: Payer: No Typology Code available for payment source | Attending: Internal Medicine | Admitting: Internal Medicine

## 2014-01-25 ENCOUNTER — Other Ambulatory Visit (HOSPITAL_COMMUNITY)
Admission: RE | Admit: 2014-01-25 | Discharge: 2014-01-25 | Disposition: A | Discharge status: Home / Self Care | Payer: Medicaid Other | Source: Ambulatory Visit | Attending: Internal Medicine | Admitting: Internal Medicine

## 2014-01-25 VITALS — BP 92/58 | HR 77 | Temp 98.4°F | Resp 18 | Wt 184.0 lb

## 2014-01-25 DIAGNOSIS — N760 Acute vaginitis: Secondary | ICD-10-CM

## 2014-01-25 DIAGNOSIS — M545 Low back pain, unspecified: Secondary | ICD-10-CM | POA: Diagnosis not present

## 2014-01-25 DIAGNOSIS — N3000 Acute cystitis without hematuria: Secondary | ICD-10-CM

## 2014-01-25 DIAGNOSIS — F4323 Adjustment disorder with mixed anxiety and depressed mood: Secondary | ICD-10-CM

## 2014-01-25 DIAGNOSIS — Z113 Encounter for screening for infections with a predominantly sexual mode of transmission: Secondary | ICD-10-CM | POA: Diagnosis not present

## 2014-01-25 DIAGNOSIS — R319 Hematuria, unspecified: Secondary | ICD-10-CM | POA: Diagnosis not present

## 2014-01-25 LAB — POCT URINALYSIS DIPSTICK
Bilirubin, UA: NEGATIVE
Blood, UA: NEGATIVE
GLUCOSE UA: NEGATIVE
Ketones, UA: NEGATIVE
LEUKOCYTES UA: NEGATIVE
Nitrite, UA: POSITIVE
Protein, UA: NEGATIVE
SPEC GRAV UA: 1.025
Urobilinogen, UA: 0.2
pH, UA: 6.5

## 2014-01-25 MED ORDER — KETOROLAC TROMETHAMINE 30 MG/ML IM SOLN: 30.0000 mg | Freq: Once | INTRAMUSCULAR | Status: DC

## 2014-01-25 MED ORDER — NAPROXEN 500 MG PO TABS: 500.0000 mg | ORAL_TABLET | Freq: Two times a day (BID) | ORAL | Status: DC

## 2014-01-25 MED ORDER — CIPROFLOXACIN HCL 250 MG PO TABS: 250.0000 mg | ORAL_TABLET | Freq: Two times a day (BID) | ORAL | Status: DC

## 2014-01-25 MED ORDER — KETOROLAC TROMETHAMINE 30 MG/ML IJ SOLN
30.0000 mg | Freq: Once | INTRAMUSCULAR | Status: AC
  Administered 2014-01-25: 30 mg via INTRAMUSCULAR

## 2014-01-25 MED ORDER — KETOROLAC TROMETHAMINE 30 MG/ML IJ SOLN: 30.0000 mg | Freq: Once | INTRAMUSCULAR | Status: DC
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
## 2014-01-29 ENCOUNTER — Other Ambulatory Visit: Payer: Self-pay | Admitting: Internal Medicine

## 2014-01-29 ENCOUNTER — Telehealth: Payer: Self-pay | Admitting: *Deleted

## 2014-01-29 DIAGNOSIS — B9689 Other specified bacterial agents as the cause of diseases classified elsewhere: Secondary | ICD-10-CM

## 2014-01-29 DIAGNOSIS — N760 Acute vaginitis: Principal | ICD-10-CM

## 2014-01-29 MED ORDER — METRONIDAZOLE 500 MG PO TABS: 500.0000 mg | ORAL_TABLET | Freq: Two times a day (BID) | ORAL | Status: DC

## 2014-01-29 NOTE — Telephone Encounter (Signed)
LCSW consulted with NP about patient continue concerns about pain. NP instructed LCSW to share that patient should continue with prescribed medication and pain management treatment.  Patient stated that she understood and repeated instructions back to LCSw.  Patient also identified that she had heartburn when she takes that Ibuprophen and shooting pain in her back during bowel movements.  LCSW will relay to NP.  Beverly Sessionsywan J Yareth Macdonnell MSW, LCSW

## 2014-01-31 ENCOUNTER — Telehealth: Payer: Self-pay | Admitting: Internal Medicine

## 2014-01-31 NOTE — Telephone Encounter (Signed)
Pt requests pain inyection today. She has appt with Tywan at 12:00 pm today Please f/u with Pt

## 2014-01-31 NOTE — Telephone Encounter (Signed)
Pt requests pain injection today. Patient just had a shot of Toradol 01/25/2014. Patient states she is still having pain mid and lower back. Patient has a visit with Tywan tomorrow at 12 noon and would like to have this done before or after this visit. Please advise

## 2014-02-01 ENCOUNTER — Ambulatory Visit: Payer: No Typology Code available for payment source | Attending: Internal Medicine | Admitting: *Deleted

## 2014-02-01 ENCOUNTER — Encounter: Payer: Self-pay | Admitting: *Deleted

## 2014-02-01 DIAGNOSIS — F4323 Adjustment disorder with mixed anxiety and depressed mood: Secondary | ICD-10-CM | POA: Diagnosis not present

## 2014-02-01 DIAGNOSIS — M545 Low back pain, unspecified: Secondary | ICD-10-CM

## 2014-02-01 MED ORDER — KETOROLAC TROMETHAMINE 30 MG/ML IJ SOLN
30.0000 mg | Freq: Once | INTRAMUSCULAR | Status: AC
  Administered 2014-02-01: 30 mg via INTRAVENOUS

## 2014-02-01 NOTE — Progress Notes (Signed)
LCSW met with patient in order to process currently challenges with conflicts. Patient identified and exemplified conflicts that she is managing in her family relationships and professional relationships.  LCSW processed through several examples of managing conflicts by working through the process of forgiveness, establishing boundaries, and developing supportive networks.  Patient was receptive to this processing.  Patient will follow up in 1 week.  Christene Lye MSW, LCSW Duration 60 minutes

## 2014-02-01 NOTE — Progress Notes (Unsigned)
Patient here today for Toradol injection for back pain.

## 2014-02-02 ENCOUNTER — Other Ambulatory Visit: Payer: Medicaid Other

## 2014-02-07 ENCOUNTER — Other Ambulatory Visit: Payer: Medicaid Other

## 2014-02-27 ENCOUNTER — Encounter: Payer: Self-pay | Admitting: Internal Medicine

## 2015-05-13 ENCOUNTER — Ambulatory Visit: Payer: Medicaid Other | Admitting: Internal Medicine

## 2015-05-31 ENCOUNTER — Encounter: Payer: Self-pay | Admitting: Internal Medicine

## 2015-05-31 ENCOUNTER — Other Ambulatory Visit (HOSPITAL_COMMUNITY)
Admission: RE | Admit: 2015-05-31 | Discharge: 2015-05-31 | Disposition: A | Discharge status: Home / Self Care | Payer: Medicaid Other | Source: Ambulatory Visit | Attending: Internal Medicine | Admitting: Internal Medicine

## 2015-05-31 ENCOUNTER — Ambulatory Visit: Payer: Medicaid Other | Attending: Internal Medicine | Admitting: Internal Medicine

## 2015-05-31 VITALS — BP 113/79 | HR 71 | Temp 98.0°F | Resp 16 | Ht 63.0 in | Wt 140.8 lb

## 2015-05-31 DIAGNOSIS — N760 Acute vaginitis: Secondary | ICD-10-CM | POA: Insufficient documentation

## 2015-05-31 DIAGNOSIS — M5441 Lumbago with sciatica, right side: Secondary | ICD-10-CM

## 2015-05-31 DIAGNOSIS — N898 Other specified noninflammatory disorders of vagina: Secondary | ICD-10-CM | POA: Diagnosis not present

## 2015-05-31 DIAGNOSIS — M25461 Effusion, right knee: Secondary | ICD-10-CM

## 2015-05-31 DIAGNOSIS — Z113 Encounter for screening for infections with a predominantly sexual mode of transmission: Secondary | ICD-10-CM | POA: Insufficient documentation

## 2015-05-31 MED ORDER — MICONAZOLE NITRATE 200 MG VA SUPP: 200.0000 mg | Freq: Every day | VAGINAL | Status: DC

## 2015-05-31 MED ORDER — TRAMADOL HCL 50 MG PO TABS: 50.0000 mg | ORAL_TABLET | Freq: Three times a day (TID) | ORAL | Status: DC | PRN
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
## 2015-06-04 ENCOUNTER — Telehealth: Payer: Self-pay | Admitting: *Deleted

## 2015-06-04 DIAGNOSIS — B9689 Other specified bacterial agents as the cause of diseases classified elsewhere: Secondary | ICD-10-CM

## 2015-06-04 DIAGNOSIS — N760 Acute vaginitis: Principal | ICD-10-CM

## 2015-06-04 LAB — CERVICOVAGINAL ANCILLARY ONLY
CHLAMYDIA, DNA PROBE: NEGATIVE
NEISSERIA GONORRHEA: NEGATIVE
WET PREP (BD AFFIRM): POSITIVE — AB

## 2015-06-04 MED ORDER — METRONIDAZOLE 500 MG PO TABS: 500.0000 mg | ORAL_TABLET | Freq: Two times a day (BID) | ORAL | Status: DC

## 2015-06-04 NOTE — Telephone Encounter (Signed)
Patient verified DOB Patient was informed of BV being present. Patient advised to begin Flaygyl BID for 7 days. Medical Assistant ordered Flagyl per NP orders. Medication was sent to [facility] per patients request. Patient expressed her understanding and had no further questions.

## 2015-10-21 ENCOUNTER — Emergency Department (HOSPITAL_COMMUNITY)
Admission: EM | Admit: 2015-10-21 | Discharge: 2015-10-22 | Disposition: A | Discharge status: Home / Self Care | Payer: Medicaid Other | Attending: Emergency Medicine | Admitting: Emergency Medicine

## 2015-10-21 ENCOUNTER — Encounter (HOSPITAL_COMMUNITY): Payer: Self-pay | Admitting: Emergency Medicine

## 2015-10-21 ENCOUNTER — Emergency Department (HOSPITAL_COMMUNITY): Payer: Medicaid Other

## 2015-10-21 DIAGNOSIS — F419 Anxiety disorder, unspecified: Secondary | ICD-10-CM | POA: Insufficient documentation

## 2015-10-21 DIAGNOSIS — W1839XA Other fall on same level, initial encounter: Secondary | ICD-10-CM | POA: Insufficient documentation

## 2015-10-21 DIAGNOSIS — Z792 Long term (current) use of antibiotics: Secondary | ICD-10-CM | POA: Diagnosis not present

## 2015-10-21 DIAGNOSIS — Y998 Other external cause status: Secondary | ICD-10-CM | POA: Insufficient documentation

## 2015-10-21 DIAGNOSIS — G8929 Other chronic pain: Secondary | ICD-10-CM | POA: Diagnosis not present

## 2015-10-21 DIAGNOSIS — S3992XA Unspecified injury of lower back, initial encounter: Secondary | ICD-10-CM | POA: Diagnosis not present

## 2015-10-21 DIAGNOSIS — M797 Fibromyalgia: Secondary | ICD-10-CM | POA: Diagnosis not present

## 2015-10-21 DIAGNOSIS — F329 Major depressive disorder, single episode, unspecified: Secondary | ICD-10-CM | POA: Insufficient documentation

## 2015-10-21 DIAGNOSIS — S93402A Sprain of unspecified ligament of left ankle, initial encounter: Secondary | ICD-10-CM

## 2015-10-21 DIAGNOSIS — Y9289 Other specified places as the place of occurrence of the external cause: Secondary | ICD-10-CM | POA: Insufficient documentation

## 2015-10-21 DIAGNOSIS — Z7952 Long term (current) use of systemic steroids: Secondary | ICD-10-CM | POA: Insufficient documentation

## 2015-10-21 DIAGNOSIS — Y9301 Activity, walking, marching and hiking: Secondary | ICD-10-CM | POA: Insufficient documentation

## 2015-10-21 DIAGNOSIS — Z79899 Other long term (current) drug therapy: Secondary | ICD-10-CM | POA: Insufficient documentation

## 2015-10-21 DIAGNOSIS — S99912A Unspecified injury of left ankle, initial encounter: Secondary | ICD-10-CM | POA: Diagnosis present

## 2015-10-21 MED ORDER — OXYCODONE-ACETAMINOPHEN 5-325 MG PO TABS
ORAL_TABLET | ORAL | Status: DC
Start: 2015-10-21 — End: 2015-10-22
  Filled 2015-10-21: qty 1

## 2015-10-21 MED ORDER — OXYCODONE-ACETAMINOPHEN 5-325 MG PO TABS
1.0000 | ORAL_TABLET | ORAL | Status: DC | PRN
  Administered 2015-10-21: 1 via ORAL
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
## 2015-10-21 NOTE — ED Provider Notes (Signed)
CSN: [id number]     Arrival date & time 10/21/15  2213 History   First MD Initiated Contact with Patient 10/21/15 2356     Chief Complaint  Patient presents with  . Fall  . Ankle Pain     (Consider location/radiation/quality/duration/timing/severity/associated sxs/prior Treatment) HPI   Katelyn Wade is a 40 y.o. female, with a history of fibromyalgia, scoliosis, and chronic back pain, presenting to the ED with injuries from a fall that occurred just prior to arrival. Pt states she was walking on a wood floor that gave way. She twisted her left ankle and landed on her buttocks. Pt c/o left foot and ankle pain along with buttocks pain. Rates all her pain at 10/10, throbbing/aching. Buttocks pain radiates down the patient's left leg. Patient denies LOC, head injury, neuro deficits, or any other complaints.  Past Medical History  Diagnosis Date  . Scoliosis   . Fibromyalgia   . Back pain   . Depression   . Anxiety    Past Surgical History  Procedure Laterality Date  . Abdominal hysterectomy     Family History  Problem Relation Age of Onset  . Bipolar disorder Mother   . Fibromyalgia Mother    Social History  Substance Use Topics  . Smoking status: Never Smoker   . Smokeless tobacco: None  . Alcohol Use: No   OB History    No data available     Review of Systems  Gastrointestinal: Negative for nausea and vomiting.  Genitourinary: Negative for difficulty urinating.  Musculoskeletal: Positive for myalgias and arthralgias.  Skin: Negative for color change and pallor.  Neurological: Negative for dizziness, syncope, weakness, light-headedness, numbness and headaches.  All other systems reviewed and are negative.     Allergies  Review of patient's allergies indicates no known allergies.  Home Medications   Prior to Admission medications   Medication Sig Start Date End Date Taking? Authorizing Provider  alprazolam Prudy Feeler(XANAX) 2 MG tablet Take 1 tablet (2 mg total) by mouth  4 (four) times daily as needed for anxiety. 10/11/13   Dorothea OgleIskra M Myers, MD  Amphetamine-Dextroamphetamine (ADDERALL PO) Take by mouth.    Historical Provider, MD  carisoprodol (SOMA) 350 MG tablet Take 350 mg by mouth 4 (four) times daily as needed for muscle spasms.    Historical Provider, MD  ciprofloxacin (CIPRO) 250 MG tablet Take 1 tablet (250 mg total) by mouth 2 (two) times daily. 01/25/14   Ambrose FinlandValerie A Keck, NP  DULoxetine (CYMBALTA) 20 MG capsule Take 20 mg by mouth daily.    Historical Provider, MD  lisdexamfetamine (VYVANSE) 30 MG capsule Take 30 mg by mouth daily.    Historical Provider, MD  methocarbamol (ROBAXIN) 500 MG tablet Take 1 tablet (500 mg total) by mouth 2 (two) times daily. 10/22/15   Martha Soltys C Markea Ruzich, PA-C  metroNIDAZOLE (FLAGYL) 500 MG tablet Take 1 tablet (500 mg total) by mouth 2 (two) times daily. 06/04/15   Ambrose FinlandValerie A Keck, NP  miconazole (MICOTIN) 200 MG vaginal suppository Place 1 suppository (200 mg total) vaginally at bedtime. 05/31/15   Ambrose FinlandValerie A Keck, NP  naproxen (NAPROSYN) 500 MG tablet Take 1 tablet (500 mg total) by mouth 2 (two) times daily. 10/22/15   Kadie Balestrieri C Kairee Isa, PA-C  OXYCODONE ER PO Take by mouth.    Historical Provider, MD  traMADol (ULTRAM) 50 MG tablet Take 1 tablet (50 mg total) by mouth every 6 (six) hours as needed. 10/22/15   Anselm PancoastShawn C Arvle Grabe, PA-C  triamcinolone  cream (KENALOG) 0.1 % Apply 1 application topically 2 (two) times daily. Do not apply to face 10/23/13   Ambrose Finland, NP  Zolpidem Tartrate (AMBIEN PO) Take by mouth.    Historical Provider, MD  ZOLPIDEM TARTRATE PO Take by mouth.    Historical Provider, MD   BP 109/59 mmHg  Pulse 77  Temp(Src) 98.1 F (36.7 C) (Oral)  Resp 18  Ht 5\' 3"  (1.6 m)  Wt 68.04 kg  BMI 26.58 kg/m2  SpO2 97% Physical Exam  Constitutional: She is oriented to person, place, and time. She appears well-developed and well-nourished. No distress.  HENT:  Head: Normocephalic and atraumatic.  Eyes: Conjunctivae are normal. Pupils  are equal, round, and reactive to light.  Neck: Normal range of motion. Neck supple.  Cardiovascular: Normal rate, regular rhythm, normal heart sounds and intact distal pulses.   Pulmonary/Chest: Effort normal and breath sounds normal. No respiratory distress.  Abdominal: Soft. There is no tenderness. There is no guarding.  Musculoskeletal: She exhibits no edema or tenderness.  Tenderness to the midline sacral spine. Full ROM in all extremities and spine. No other paraspinal tenderness.   Lymphadenopathy:    She has no cervical adenopathy.  Neurological: She is alert and oriented to person, place, and time. She has normal reflexes.  No sensory deficits. Strength 5/5 in all extremities. No gait disturbance. Coordination intact.   Skin: Skin is warm and dry. She is not diaphoretic.  Psychiatric: She has a normal mood and affect. Her behavior is normal.  Nursing note and vitals reviewed.   ED Course  Procedures (including critical care time)  Imaging Review Dg Lumbar Spine Complete  10/22/2015  CLINICAL DATA:  Larey Seat through a floor yesterday morning. Pain in coccyx area and low back pain. EXAM: LUMBAR SPINE - COMPLETE 4+ VIEW COMPARISON:  CT abdomen and pelvis 01/22/2014 FINDINGS: Partial sacralization of L5 on the right. Normal alignment of the lumbar spine. No vertebral compression deformities. No focal bone lesion or bone destruction. Bone cortex appears intact. Normal alignment of the facet joints. IMPRESSION: No acute displaced fractures identified. Electronically Signed   By: Burman Nieves M.D.   On: 10/22/2015 01:13   Dg Sacrum/coccyx  10/22/2015  CLINICAL DATA:  Pain after a fall. EXAM: SACRUM AND COCCYX - 2+ VIEW COMPARISON:  CT abdomen and pelvis 01/22/2014 FINDINGS: There is no evidence of fracture or other focal bone lesions. IMPRESSION: Negative. Electronically Signed   By: Burman Nieves M.D.   On: 10/22/2015 01:14   Dg Ankle Complete Left  10/21/2015  CLINICAL DATA:  Fell  throat floor today. Now with pain in the left ankle. EXAM: LEFT ANKLE COMPLETE - 3+ VIEW COMPARISON:  None. FINDINGS: There is no evidence of fracture, dislocation, or joint effusion. There is no evidence of arthropathy or other focal bone abnormality. Soft tissues are unremarkable. IMPRESSION: Negative. Electronically Signed   By: Ellery Plunk M.D.   On: 10/21/2015 22:56   I have personally reviewed and evaluated these images as part of my medical decision-making.   EKG Interpretation None      MDM   Final diagnoses:  Ankle sprain, left, initial encounter    Skylor Hughson presents with lower back pain and left ankle pain following a fall that occurred today.  No functional or neurologic deficits. Patient stated that she could not move due to the pain, but was then observed rolling around the bed and walking to the bathroom unassisted. Patient admits during the encounter that  she is not as worried about getting the imaging as she is about getting pain medications. Patient to follow up with PCP should symptoms continue. Return precautions discussed. Patient voiced understanding of these instructions and is comfortable with discharge.  Anselm Pancoast, PA-C 10/23/15 1655  Shon Baton, MD 10/30/15 [phone]

## 2015-10-22 ENCOUNTER — Emergency Department (HOSPITAL_COMMUNITY): Payer: Medicaid Other

## 2015-10-22 MED ORDER — NAPROXEN 500 MG PO TABS: 500.0000 mg | ORAL_TABLET | Freq: Two times a day (BID) | ORAL | Status: DC

## 2015-10-22 MED ORDER — METHOCARBAMOL 500 MG PO TABS: 500.0000 mg | ORAL_TABLET | Freq: Two times a day (BID) | ORAL | Status: DC

## 2015-10-22 MED ORDER — KETOROLAC TROMETHAMINE 60 MG/2ML IM SOLN
60.0000 mg | Freq: Once | INTRAMUSCULAR | Status: AC
  Administered 2015-10-22: 60 mg via INTRAMUSCULAR
  Filled 2015-10-22: qty 2

## 2015-10-22 MED ORDER — TRAMADOL HCL 50 MG PO TABS: 50.0000 mg | ORAL_TABLET | Freq: Four times a day (QID) | ORAL | Status: DC | PRN

## 2015-10-22 NOTE — Discharge Instructions (Signed)
You have been seen today for evaluation after a fall. Your imaging showed no abnormalities. It appears as though you have an ankle sprain. Rest, ice, compression, elevation, and anti-inflammatory medications, such as naproxen or ibuprofen should be used. Ice and anti-inflammatory medications for the back and buttocks pain. Follow up with PCP as needed should symptoms continue. Return to ED should symptoms worsen.

## 2015-10-24 ENCOUNTER — Encounter: Payer: Self-pay | Admitting: Physician Assistant

## 2015-10-24 ENCOUNTER — Ambulatory Visit: Payer: Medicaid Other | Attending: Internal Medicine | Admitting: Physician Assistant

## 2015-10-24 ENCOUNTER — Other Ambulatory Visit (HOSPITAL_COMMUNITY)
Admission: RE | Admit: 2015-10-24 | Discharge: 2015-10-24 | Disposition: A | Discharge status: Home / Self Care | Payer: Medicaid Other | Source: Ambulatory Visit | Attending: Internal Medicine | Admitting: Internal Medicine

## 2015-10-24 VITALS — BP 107/66 | HR 71 | Temp 97.9°F | Wt 146.8 lb

## 2015-10-24 DIAGNOSIS — N898 Other specified noninflammatory disorders of vagina: Secondary | ICD-10-CM | POA: Insufficient documentation

## 2015-10-24 DIAGNOSIS — F329 Major depressive disorder, single episode, unspecified: Secondary | ICD-10-CM | POA: Insufficient documentation

## 2015-10-24 DIAGNOSIS — N760 Acute vaginitis: Secondary | ICD-10-CM | POA: Diagnosis present

## 2015-10-24 DIAGNOSIS — F419 Anxiety disorder, unspecified: Secondary | ICD-10-CM | POA: Diagnosis not present

## 2015-10-24 DIAGNOSIS — M797 Fibromyalgia: Secondary | ICD-10-CM | POA: Insufficient documentation

## 2015-10-24 DIAGNOSIS — S300XXA Contusion of lower back and pelvis, initial encounter: Secondary | ICD-10-CM | POA: Insufficient documentation

## 2015-10-24 DIAGNOSIS — S93402A Sprain of unspecified ligament of left ankle, initial encounter: Secondary | ICD-10-CM | POA: Diagnosis present

## 2015-10-24 DIAGNOSIS — M419 Scoliosis, unspecified: Secondary | ICD-10-CM | POA: Insufficient documentation

## 2015-10-24 DIAGNOSIS — Z113 Encounter for screening for infections with a predominantly sexual mode of transmission: Secondary | ICD-10-CM | POA: Insufficient documentation

## 2015-10-24 DIAGNOSIS — Z79899 Other long term (current) drug therapy: Secondary | ICD-10-CM | POA: Diagnosis not present

## 2015-10-24 MED ORDER — ACETAMINOPHEN-CODEINE #3 300-30 MG PO TABS: 1.0000 | ORAL_TABLET | ORAL | Status: DC | PRN

## 2015-10-24 MED ORDER — METHOCARBAMOL 500 MG PO TABS: 500.0000 mg | ORAL_TABLET | Freq: Four times a day (QID) | ORAL | Status: DC

## 2015-10-24 MED ORDER — TRAMADOL HCL 50 MG PO TABS: 50.0000 mg | ORAL_TABLET | Freq: Four times a day (QID) | ORAL | Status: DC | PRN

## 2015-10-24 NOTE — Progress Notes (Signed)
Patient ID: Katelyn Wade, female   DOB: 03/10/1976, 40 y.o.   MRN: [id number]   Katelyn Wade, is a 40 y.o. female  [id number]  [id number]  DOB - 01/23/1976  Chief Complaint  Patient presents with  . Follow-up    ED - L sprained ankle        Subjective:  Chief Complaint and HPI: Katelyn Wade is a 40 y.o. female here today for ED follow up.  She was seen in the ED 10/21/2015 after a fall at the home where she was staying.  She says the home has now been condemned because of poor flooring and she is having to move out. She is having pain in her L ankle and Buttocks. She says she is no longer being seen at pain management.  Her last appointment with them was 08/2015.  She says her pain is 10/10. Her xrays were negative.  She was given an ACE wrap for her ankle.  She is requesting pain meds.  Also, she is c/o vaginal discharge and burning.  Denies pelvic pain/fever/nausea.    ED/Hospital notes reviewed.    ROS:   Constitutional:  No f/c, No night sweats, No unexplained weight loss. EENT:  No vision changes, No blurry vision, No hearing changes. No mouth, throat, or ear problems.  Respiratory: No cough, No SOB Cardiac: No CP, no palpitations GI:  No abd pain, No N/V/D. GU: No Urinary s/sx. + vaginal discharge.  Musculoskeletal: pain in coccyx area and L ankle Neuro: No headache, no dizziness, no motor weakness.  Skin: No rash Endocrine:  No polydipsia. No polyuria.  Psych: Denies SI/HI    ALLERGIES: No Known Allergies  PAST MEDICAL HISTORY: Past Medical History  Diagnosis Date  . Scoliosis   . Fibromyalgia   . Back pain   . Depression   . Anxiety     MEDICATIONS AT HOME: Prior to Admission medications   Medication Sig Start Date End Date Taking? Authorizing Provider  methocarbamol (ROBAXIN) 500 MG tablet Take 1 tablet (500 mg total) by mouth 4 (four) times daily. 10/24/15  Yes Marzella SchleinAngela M Nivan Melendrez, PA-C  naproxen (NAPROSYN) 500 MG tablet Take 1 tablet (500 mg total) by  mouth 2 (two) times daily. 10/22/15  Yes Shawn C Joy, PA-C  acetaminophen-codeine (TYLENOL #3) 300-30 MG tablet Take 1 tablet by mouth every 4 (four) hours as needed for moderate pain. 10/24/15   Anders SimmondsAngela M Corbyn Steedman, PA-C  alprazolam Prudy Feeler(XANAX) 2 MG tablet Take 1 tablet (2 mg total) by mouth 4 (four) times daily as needed for anxiety. Patient not taking: Reported on 10/24/2015 10/11/13   Dorothea OgleIskra M Myers, MD  Amphetamine-Dextroamphetamine (ADDERALL PO) Take by mouth. Reported on 10/24/2015    Historical Provider, MD  carisoprodol (SOMA) 350 MG tablet Take 350 mg by mouth 4 (four) times daily as needed for muscle spasms. Reported on 10/24/2015    Historical Provider, MD  ciprofloxacin (CIPRO) 250 MG tablet Take 1 tablet (250 mg total) by mouth 2 (two) times daily. Patient not taking: Reported on 10/24/2015 01/25/14   Ambrose FinlandValerie A Keck, NP  DULoxetine (CYMBALTA) 20 MG capsule Take 20 mg by mouth daily. Reported on 10/24/2015    Historical Provider, MD  lisdexamfetamine (VYVANSE) 30 MG capsule Take 30 mg by mouth daily. Reported on 10/24/2015    Historical Provider, MD  metroNIDAZOLE (FLAGYL) 500 MG tablet Take 1 tablet (500 mg total) by mouth 2 (two) times daily. Patient not taking: Reported on 10/24/2015 06/04/15   Ambrose FinlandValerie A Keck,  NP  miconazole (MICOTIN) 200 MG vaginal suppository Place 1 suppository (200 mg total) vaginally at bedtime. Patient not taking: Reported on 10/24/2015 05/31/15   Ambrose Finland, NP  OXYCODONE ER PO Take by mouth. Reported on 10/24/2015    Historical Provider, MD  triamcinolone cream (KENALOG) 0.1 % Apply 1 application topically 2 (two) times daily. Do not apply to face Patient not taking: Reported on 10/24/2015 10/23/13   Ambrose Finland, NP  Zolpidem Tartrate (AMBIEN PO) Take by mouth. Reported on 10/24/2015    Historical Provider, MD  ZOLPIDEM TARTRATE PO Take by mouth. Reported on 10/24/2015    Historical Provider, MD     Objective:  EXAM:   Filed Vitals:   10/24/15 1417  BP: 107/66  Pulse: 71  Temp:  97.9 F (36.6 C)  TempSrc: Oral  Weight: 146 lb 12.8 oz (66.588 kg)    General appearance : A&OX3. NAD. Non-toxic-appearing.  Well-dressed, full make-up.  HEENT: Atraumatic and Normocephalic.  PERRLA. EOM intact.  TM clear B. Mouth-MMM, post pharynx WNL w/o erythema, No PND. Neck: supple, no JVD. No cervical lymphadenopathy. No thyromegaly Chest/Lungs:  Breathing-non-labored, Good air entry bilaterally, breath sounds normal without rales, rhonchi, or wheezing  CVS: S1 S2 regular, no murmurs, gallops, rubs  GU:  External vaginal are/vulva/introitus WNL.  Speculum inserted.  Whitish discharge present. Cervix w/o lesion.  GC/Chlam and wet prep taken.  Bimanual exam is unremarkable.  Extremities: Bilateral Lower Ext shows no edema, both legs are warm to touch with = pulse throughout. No swelling of either ankle.  With distraction, she tolerates the exam.  Without distraction, even touching the skin of her L ankle and foot elicits pain.  Neurology:  CN II-XII grossly intact, Non focal.   Psych:  TP linear. J/I WNL. Normal speech. Appropriate eye contact and affect.  Skin:  No Rash  Data Review Lab Results  Component Value Date   HGBA1C 5.2 11/01/2013     Assessment & Plan   1. Sprain of ankle, left, initial encounter Rx for short cam walker given.  She has crutches she can use as needed. RICE therapy advised.   2. Vaginal discharge - Cervicovaginal ancillary only  3. Contusion, buttock, initial encounter Written Rx for Donut cushion given Since she is no longer being seen at Pain Management, I did give her a prescription of Tylenol #3. I increased the frequency that she can take methocarbamol to qid vs bid.    Patient have been counseled extensively about nutrition and exercise  Return in about 3 months (around 01/24/2016).  The patient was given clear instructions to go to ER or return to medical center if symptoms don't improve, worsen or new problems develop. The patient  verbalized understanding. The patient was told to call to get lab results if they haven't heard anything in the next week.   Georgian Co, PA-C [facility] [place], [place] [phone]   10/24/2015, 4:40 PM

## 2015-10-24 NOTE — Patient Instructions (Signed)
Ankle Sprain  An ankle sprain is an injury to the strong, fibrous tissues (ligaments) that hold the bones of your ankle joint together.   CAUSES  An ankle sprain is usually caused by a fall or by twisting your ankle. Ankle sprains most commonly occur when you step on the outer edge of your foot, and your ankle turns inward. People who participate in sports are more prone to these types of injuries.   SYMPTOMS    Pain in your ankle. The pain may be present at rest or only when you are trying to stand or walk.   Swelling.   Bruising. Bruising may develop immediately or within 1 to 2 days after your injury.   Difficulty standing or walking, particularly when turning corners or changing directions.  DIAGNOSIS   Your caregiver will ask you details about your injury and perform a physical exam of your ankle to determine if you have an ankle sprain. During the physical exam, your caregiver will press on and apply pressure to specific areas of your foot and ankle. Your caregiver will try to move your ankle in certain ways. An X-ray exam may be done to be sure a bone was not broken or a ligament did not separate from one of the bones in your ankle (avulsion fracture).   TREATMENT   Certain types of braces can help stabilize your ankle. Your caregiver can make a recommendation for this. Your caregiver may recommend the use of medicine for pain. If your sprain is severe, your caregiver may refer you to a surgeon who helps to restore function to parts of your skeletal system (orthopedist) or a physical therapist.  HOME CARE INSTRUCTIONS    Apply ice to your injury for 1-2 days or as directed by your caregiver. Applying ice helps to reduce inflammation and pain.    Put ice in a plastic bag.    Place a towel between your skin and the bag.    Leave the ice on for 15-20 minutes at a time, every 2 hours while you are awake.   Only take over-the-counter or prescription medicines for pain, discomfort, or fever as directed by  your caregiver.   Elevate your injured ankle above the level of your heart as much as possible for 2-3 days.   If your caregiver recommends crutches, use them as instructed. Gradually put weight on the affected ankle. Continue to use crutches or a cane until you can walk without feeling pain in your ankle.   If you have a plaster splint, wear the splint as directed by your caregiver. Do not rest it on anything harder than a pillow for the first 24 hours. Do not put weight on it. Do not get it wet. You may take it off to take a shower or bath.   You may have been given an elastic bandage to wear around your ankle to provide support. If the elastic bandage is too tight (you have numbness or tingling in your foot or your foot becomes cold and blue), adjust the bandage to make it comfortable.   If you have an air splint, you may blow more air into it or let air out to make it more comfortable. You may take your splint off at night and before taking a shower or bath. Wiggle your toes in the splint several times per day to decrease swelling.  SEEK MEDICAL CARE IF:    You have rapidly increasing bruising or swelling.   Your toes feel   extremely cold or you lose feeling in your foot.   Your pain is not relieved with medicine.  SEEK IMMEDIATE MEDICAL CARE IF:   Your toes are numb or blue.   You have severe pain that is increasing.  MAKE SURE YOU:    Understand these instructions.   Will watch your condition.   Will get help right away if you are not doing well or get worse.     This information is not intended to replace advice given to you by your health care provider. Make sure you discuss any questions you have with your health care provider.     Document Released: 06/08/2005 Document Revised: 06/29/2014 Document Reviewed: 06/20/2011  Elsevier Interactive Patient Education 2016 [employer]

## 2015-10-26 LAB — CERVICOVAGINAL ANCILLARY ONLY
Chlamydia: NEGATIVE
Neisseria Gonorrhea: NEGATIVE
WET PREP (BD AFFIRM): POSITIVE — AB

## 2015-10-29 ENCOUNTER — Other Ambulatory Visit: Payer: Self-pay | Admitting: Physician Assistant

## 2015-10-29 DIAGNOSIS — N760 Acute vaginitis: Principal | ICD-10-CM

## 2015-10-29 DIAGNOSIS — B9689 Other specified bacterial agents as the cause of diseases classified elsewhere: Secondary | ICD-10-CM

## 2015-10-29 MED ORDER — METRONIDAZOLE 500 MG PO TABS: 500.0000 mg | ORAL_TABLET | Freq: Two times a day (BID) | ORAL | Status: DC

## 2015-11-04 ENCOUNTER — Telehealth: Payer: Self-pay | Admitting: Internal Medicine

## 2015-11-04 NOTE — Telephone Encounter (Signed)
Pt. Called stating that she was prescribed the following medications:  acetaminophen-codeine (TYLENOL #3) 300-30 MG tablet  methocarbamol (ROBAXIN) 500 MG tablet   Pt. Can not get the Rx filled b/c the provider is not in her insurance and she would like the medications switched to a provider that is in her insurance. Please f/u

## 2015-11-05 NOTE — Telephone Encounter (Signed)
Medical Assistant left message on patient's home and cell voicemail. Voicemail states to [place] [facility] at [phone].  !!!Patient needs to contact insurance and have her provider changed!!!

## 2015-11-06 ENCOUNTER — Other Ambulatory Visit: Payer: Self-pay | Admitting: *Deleted

## 2015-11-06 ENCOUNTER — Telehealth: Payer: Self-pay | Admitting: Internal Medicine

## 2015-11-06 MED ORDER — ACETAMINOPHEN-CODEINE #3 300-30 MG PO TABS: 1.0000 | ORAL_TABLET | ORAL | Status: DC | PRN

## 2015-11-06 MED ORDER — METHOCARBAMOL 500 MG PO TABS: 500.0000 mg | ORAL_TABLET | Freq: Four times a day (QID) | ORAL | Status: DC

## 2015-11-06 NOTE — Telephone Encounter (Signed)
Katelyn Wade is not a Medicaid approved provider. Medications had to be reordered under MD position Jegede. Patient was seen in the office 10/24/15

## 2015-11-06 NOTE — Telephone Encounter (Signed)
Patient states per pharmacy to please resend medications with another provider's name.   Please send to [facility] on  [address]  Tylenol #3 and Robaxin  Patient also is requesting assistance with getting a boot to assist with the healing of her foot. Patient states she has paper script and every location she goes to is asking for her to pay out of pocket  Please follow up with patient

## 2015-11-07 ENCOUNTER — Other Ambulatory Visit: Payer: Self-pay | Admitting: *Deleted

## 2015-11-07 ENCOUNTER — Other Ambulatory Visit: Payer: Self-pay | Admitting: Internal Medicine

## 2015-11-07 DIAGNOSIS — N760 Acute vaginitis: Principal | ICD-10-CM

## 2015-11-07 DIAGNOSIS — B9689 Other specified bacterial agents as the cause of diseases classified elsewhere: Secondary | ICD-10-CM

## 2015-11-07 MED ORDER — METRONIDAZOLE 500 MG PO TABS: 500.0000 mg | ORAL_TABLET | Freq: Two times a day (BID) | ORAL | Status: DC

## 2015-11-07 NOTE — Telephone Encounter (Signed)
Patients medication was reordered due to provider not being medicaid eligible just yet.

## 2016-05-05 ENCOUNTER — Telehealth: Payer: Self-pay | Admitting: Internal Medicine

## 2016-05-05 NOTE — Telephone Encounter (Signed)
Patient called the office to speak with the nurse regarding her referral and the boot that was prescribed. Pt continues to experience pain. Please follow up.  Thank you.

## 2016-05-06 NOTE — Telephone Encounter (Signed)
Patient will need and office visit to address her concern. She has not been seen on 6 months

## 2016-05-06 NOTE — Telephone Encounter (Signed)
Please schedule pt an appointment  

## 2016-05-06 NOTE — Telephone Encounter (Signed)
Will forward to Dr. Jegede.  

## 2016-05-12 ENCOUNTER — Ambulatory Visit: Payer: Medicaid Other | Admitting: Internal Medicine

## 2016-05-12 ENCOUNTER — Encounter: Payer: Self-pay | Admitting: Internal Medicine

## 2016-05-12 ENCOUNTER — Ambulatory Visit: Payer: Medicaid Other | Attending: Internal Medicine | Admitting: Internal Medicine

## 2016-05-12 ENCOUNTER — Encounter: Payer: Self-pay | Admitting: Licensed Clinical Social Worker

## 2016-05-12 VITALS — BP 114/77 | HR 70 | Temp 98.4°F | Resp 16 | Wt 138.8 lb

## 2016-05-12 DIAGNOSIS — M797 Fibromyalgia: Secondary | ICD-10-CM | POA: Diagnosis not present

## 2016-05-12 DIAGNOSIS — M5442 Lumbago with sciatica, left side: Secondary | ICD-10-CM | POA: Insufficient documentation

## 2016-05-12 DIAGNOSIS — S93402D Sprain of unspecified ligament of left ankle, subsequent encounter: Secondary | ICD-10-CM

## 2016-05-12 DIAGNOSIS — M25572 Pain in left ankle and joints of left foot: Secondary | ICD-10-CM | POA: Diagnosis present

## 2016-05-12 DIAGNOSIS — M5441 Lumbago with sciatica, right side: Secondary | ICD-10-CM | POA: Diagnosis not present

## 2016-05-12 DIAGNOSIS — M419 Scoliosis, unspecified: Secondary | ICD-10-CM | POA: Diagnosis not present

## 2016-05-12 DIAGNOSIS — W19XXXD Unspecified fall, subsequent encounter: Secondary | ICD-10-CM | POA: Diagnosis not present

## 2016-05-12 DIAGNOSIS — F329 Major depressive disorder, single episode, unspecified: Secondary | ICD-10-CM

## 2016-05-12 DIAGNOSIS — F411 Generalized anxiety disorder: Secondary | ICD-10-CM

## 2016-05-12 DIAGNOSIS — F32A Depression, unspecified: Secondary | ICD-10-CM

## 2016-05-12 MED ORDER — CYCLOBENZAPRINE HCL 10 MG PO TABS: 10.0000 mg | ORAL_TABLET | Freq: Three times a day (TID) | ORAL | 0 refills | Status: DC | PRN

## 2016-05-12 MED ORDER — ESCITALOPRAM OXALATE 10 MG PO TABS: 5.0000 mg | ORAL_TABLET | Freq: Every day | ORAL | 1 refills | Status: DC

## 2016-05-12 MED ORDER — PREDNISONE 20 MG PO TABS: 40.0000 mg | ORAL_TABLET | Freq: Every day | ORAL | 0 refills | Status: DC

## 2016-05-12 MED ORDER — TRAMADOL HCL 50 MG PO TABS: 50.0000 mg | ORAL_TABLET | Freq: Three times a day (TID) | ORAL | 0 refills | Status: DC | PRN

## 2016-05-12 NOTE — Progress Notes (Signed)
Pt is in the office today for ankle pain Pt states her pain level is a 10 Pt states she had a injury back in may Pt states she was seen in may and there was a referral put in but she hasn't heard anything regarding referral Pt states she is needing something for pain Pt states she would like some xanax to help kill the nerve and ease her Pt states the pain is messing with her quality of life

## 2016-05-12 NOTE — Patient Instructions (Signed)
Low Back Strain A strain is a stretch or tear in a muscle or the strong cords of tissue that attach muscle to bone (tendons). Strains of the lower back (lumbar spine) are a common cause of low back pain. A strain occurs when muscles or tendons are torn or are stretched beyond their limits. The muscles may become inflamed, resulting in pain and sudden muscle tightening (spasms). A strain can happen suddenly due to an injury (trauma), or it can develop gradually due to overuse. There are three types of strains:  Grade 1 is a mild strain involving a minor tear of the muscle fibers or tendons. This may cause some pain but no loss of muscle strength.  Grade 2 is a moderate strain involving a partial tear of the muscle fibers or tendons. This causes more severe pain and some loss of muscle strength.  Grade 3 is a severe strain involving a complete tear of the muscle or tendon. This causes severe pain and complete or nearly complete loss of muscle strength. What are the causes? This condition may be caused by:  Trauma, such as a fall or a hit to the body.  Twisting or overstretching the back. This may result from doing activities that require a lot of energy, such as lifting heavy objects. What increases the risk? The following factors may increase your risk of getting this condition:  Playing contact sports.  Participating in sports or activities that put excessive stress on the back and require a lot of bending and twisting, including:  Lifting weights or heavy objects.  Gymnastics.  Soccer.  Figure skating.  Snowboarding.  Being overweight or obese.  Having poor strength and flexibility. What are the signs or symptoms? Symptoms of this condition may include:  Sharp or dull pain in the lower back that does not go away. Pain may extend to the buttocks.  Stiffness.  Limited range of motion.  Inability to stand up straight due to stiffness or pain.  Muscle spasms. How is this  diagnosed?   This condition may be diagnosed based on:  Your symptoms.  Your medical history.  A physical exam.  Your health care provider may push on certain areas of your back to determine the source of your pain.  You may be asked to bend forward, backward, and side to side to assess the severity of your pain and your range of motion.  Imaging tests, such as:  X-rays.  MRI. How is this treated? Treatment for this condition may include:  Applying heat and cold to the affected area.  Medicines to help relieve pain and to relax your muscles (muscle relaxants).  NSAIDs to help reduce swelling and discomfort.  Physical therapy. When your symptoms improve, it is important to gradually return to your normal routine as soon as possible to reduce pain, avoid stiffness, and avoid loss of muscle strength. Generally, symptoms should improve within 6 weeks of treatment. However, recovery time varies. Follow these instructions at home: Managing pain, stiffness, and swelling  If directed, apply ice to the injured area during the first 24 hours after your injury.  Put ice in a plastic bag.  Place a towel between your skin and the bag.  Leave the ice on for 20 minutes, 2-3 times a day.  If directed, apply heat to the affected area as often as told by your health care provider. Use the heat source that your health care provider recommends, such as a moist heat pack or a heating pad.    Place a towel between your skin and the heat source.  Leave the heat on for 20-30 minutes.  Remove the heat if your skin turns bright red. This is especially important if you are unable to feel pain, heat, or cold. You may have a greater risk of getting burned. Activity  Rest and return to your normal activities as told by your health care provider. Ask your health care provider what activities are safe for you.  Avoid activities that take a lot of effort (are strenuous) for as long as told by your  health care provider.  Do exercises as told by your health care provider. General instructions  Take over-the-counter and prescription medicines only as told by your health care provider.  If you have questions or concerns about safety while taking pain medicine, talk with your health care provider.  Do not drive or operate heavy machinery until you know how your pain medicine affects you.  Do not use any tobacco products, such as cigarettes, chewing tobacco, and e-cigarettes. Tobacco can delay bone healing. If you need help quitting, ask your health care provider.  Keep all follow-up visits as told by your health care provider. This is important. How is this prevented?  Warm up and stretch before being active.  Cool down and stretch after being active.  Give your body time to rest between periods of activity.  Avoid:  Being physically inactive for long periods at a time.  Exercising or playing sports when you are tired or in pain.  Use correct form when playing sports and lifting heavy objects.  Use good posture when sitting and standing.  Maintain a healthy weight.  Sleep on a mattress with medium firmness to support your back.  Make sure to use equipment that fits you, including shoes that fit well.  Be safe and responsible while being active to avoid falls.  Do at least 150 minutes of moderate-intensity exercise each week, such as brisk walking or water aerobics. Try a form of exercise that takes stress off your back, such as swimming or stationary cycling.  Maintain physical fitness, including:  Strength.  Flexibility.  Cardiovascular fitness.  Endurance. Contact a health care provider if:  Your back pain does not improve after 6 weeks of treatment.  Your symptoms get worse. Get help right away if:  Your back pain is severe.  You are unable to stand or walk.  You develop pain in your legs.  You develop weakness in your buttocks or legs.  You have  difficulty controlling when you urinate or when you have a bowel movement. This information is not intended to replace advice given to you by your health care provider. Make sure you discuss any questions you have with your health care provider. Document Released: 06/08/2005 Document Revised: 02/13/2016 Document Reviewed: 03/20/2015 Elsevier Interactive Patient Education  2017 [employer]  -   RICE for Routine Care of Injuries Introduction Many injuries can be cared for using rest, ice, compression, and elevation (RICE therapy). Using RICE therapy can help to lessen pain and swelling. It can help your body to heal. Rest  Reduce your normal activities and avoid using the injured part of your body. You can go back to your normal activities when you feel okay and your doctor says it is okay. Ice  Do not put ice on your bare skin.  Put ice in a plastic bag.  Place a towel between your skin and the bag.  Leave the ice on for  20 minutes, 2-3 times a day. Do this for as long as told by your doctor. Compression  Compression means putting pressure on the injured area. This can be done with an elastic bandage. If an elastic bandage has been applied:  Remove and reapply the bandage every 3-4 hours or as told by your doctor.  Make sure the bandage is not wrapped too tight. Wrap the bandage more loosely if part of your body beyond the bandage is blue, swollen, cold, painful, or loses feeling (numb).  See your doctor if the bandage seems to make your problems worse. Elevation  Elevation means keeping the injured area raised. Raise the injured area above your heart or the center of your chest if you can. When should I get help? You should get help if:  You keep having pain and swelling.  Your symptoms get worse. Get help right away if: You should get help right away if:  You have sudden bad pain at or below the area of your injury.  You have redness or more swelling around your  injury.  You have tingling or numbness at or below the injury that does not go away when you take off the bandage. This information is not intended to replace advice given to you by your health care provider. Make sure you discuss any questions you have with your health care provider. Document Released: 11/25/2007 Document Revised: 11/14/2015 Document Reviewed: 05/16/2014  2017 Elsevier    Lexapro / Escitalopram Escitalopram is used to treat depression and anxiety. It works by helping to restore the balance of a certain natural substance (serotonin) in the brain. Escitalopram belongs to a class of drugs known as selective serotonin reuptake inhibitors (SSRI). It may improve your energy level and feelings of well-being and decrease nervousness. How to use Lexapro Read the Medication Guide and, if available, the Patient Information Leaflet provided by your pharmacist before you start taking escitalopram and each time you get a refill. If you have any questions, ask your doctor or pharmacist. Take this medication by mouth with or without food as directed by your doctor, usually once daily in the morning or evening. The dosage is based on your medical condition, response to treatment, age, and other medications you may be taking. Be sure to tell your doctor and pharmacist about all the products you use (including prescription drugs, nonprescription drugs, and herbal products). If you are using the liquid form of this medication, carefully measure the dose using a special measuring device/spoon. Do not use a household spoon because you may not get the correct dose. To reduce your risk of side effects, your doctor may direct you to start taking this drug at a low dose and gradually increase your dose. Follow your doctor's instructions carefully. Do not increase your dose or use this drug more often or for longer than prescribed. Your condition will not improve any faster, and your risk of side effects will  increase. Take this medication regularly to get the most benefit from it. To help you remember, take it at the same time each day. It is important to continue taking this medication even if you feel well. Do not stop taking this medication without consulting your doctor. Some conditions may become worse when this drug is suddenly stopped. Also, you may experience symptoms such as mood swings, headache, tiredness, sleep changes, and brief feelings similar to electric shock. To prevent these symptoms while you are stopping treatment with this drug, your doctor may reduce your  dose gradually. Consult your doctor or pharmacist for more details. Report any new or worsening symptoms right away. It may take 1 to 2 weeks to feel a benefit from this drug and 4 weeks to feel the full benefit of this medication. Tell your doctor if your condition does not improve or if it worsens.   Side Effects See also Warning section. Nausea, dry mouth, trouble sleeping, constipation, tiredness, drowsiness, dizziness, and increased sweating may occur. If any of these effects persist or worsen, tell your doctor promptly. Remember that your doctor has prescribed this medication because he or she has judged that the benefit to you is greater than the risk of side effects. Many people using this medication do not have serious side effects. Tell your doctor right away if you have any serious side effects, including: decreased interest in sex, changes in sexual ability, easy bruising/bleeding. Get medical help right away if you have any very serious side effects, including: bloody/black/tarry stools, fainting, fast/irregular heartbeat, vomit that looks like coffee grounds, seizures, eye pain/swelling/redness, widened pupils, vision changes (such as seeing rainbows around lights at night, blurred vision). This medication may increase serotonin and rarely cause a very serious condition called serotonin syndrome/toxicity. The risk  increases if you are also taking other drugs that increase serotonin, so tell your doctor or pharmacist of all the drugs you take (see Drug Interactions section). Get medical help right away if you develop some of the following symptoms: fast heartbeat, hallucinations, loss of coordination, severe dizziness, severe nausea/vomiting/diarrhea, twitching muscles, unexplained fever, unusual agitation/restlessness. Rarely, males may have a painful or prolonged erection lasting 4 or more hours. If this occurs, stop using this drug and get medical help right away, or permanent problems could occur. A very serious allergic reaction to this drug is rare. However, get medical help right away if you notice any symptoms of a serious allergic reaction, including: rash, itching/swelling (especially of the face/tongue/throat), severe dizziness, trouble breathing. This is not a complete list of possible side effects. If you notice other effects not listed above, contact your doctor or pharmacist. In the [place] - Call your doctor for medical advice about side effects. You may report side effects to FDA at 1-800-FDA-1088 or at [employer]. In [place] - Call your doctor for medical advice about side effects. You may report side effects to [facility] [place] at (516)[phone]. List Lexapro side effects by likelihood and severity.  Precautions Before taking escitalopram, tell your doctor or pharmacist if you are allergic to it; or to citalopram; or if you have any other allergies. This product may contain inactive ingredients, which can cause allergic reactions or other problems. Talk to your pharmacist for more details. Before using this medication, tell your doctor or pharmacist your medical history, especially of: personal or family history of bipolar/manic-depressive disorder, personal or family history of suicide attempts, liver disease, seizures, intestinal ulcers/bleeding (peptic ulcer disease) or bleeding problems, low  sodium in the blood (hyponatremia), personal or family history of glaucoma (angle-closure type). Escitalopram may cause a condition that affects the heart rhythm (QT prolongation). QT prolongation can rarely cause serious (rarely fatal) fast/irregular heartbeat and other symptoms (such as severe dizziness, fainting) that need medical attention right away. The risk of QT prolongation may be increased if you have certain medical conditions or are taking other drugs that may cause QT prolongation. Before using escitalopram, tell your doctor or pharmacist of all the drugs you take and if you have any of the following conditions: certain heart  problems (heart failure, slow heartbeat, recent heart attack, QT prolongation in the EKG), family history of certain heart problems (QT prolongation in the EKG, sudden cardiac death). Low levels of potassium or magnesium in the blood may also increase your risk of QT prolongation. This risk may increase if you use certain drugs (such as diuretics/"water pills") or if you have conditions such as severe sweating, diarrhea, or vomiting. Talk to your doctor about using escitalopram safely. This drug may make you dizzy or drowsy. Do not drive, use machinery, or do any activity that requires alertness until you are sure you can perform such activities safely. Avoid alcoholic beverages. The liquid form of this medication may contain sugar and/or aspartame. Caution is advised if you have diabetes, phenylketonuria (PKU), or any other condition that requires you to limit/avoid these substances in your diet. Ask your doctor or pharmacist about using this medication safely. Before having surgery, tell your doctor or dentist about all the products you use (including prescription drugs, nonprescription drugs, and herbal products). Older adults may be more sensitive to the side effects of this drug, such as QT prolongation (see above), loss of coordination, or bleeding. They may also be  more likely to lose too much salt (hyponatremia), especially if they are also taking "water pills" (diuretics) with this medication. Loss of coordination can increase the risk of falling. Children may be more sensitive to the side effects of this drug, especially loss of appetite and weight loss. Monitor weight and height in children who are taking this drug. During pregnancy, this medication should be used only when clearly needed. It may harm an unborn baby. Also, babies born to mothers who have used this drug during the last 3 months of pregnancy may rarely develop withdrawal symptoms such as feeding/breathing difficulties, seizures, muscle stiffness, or constant crying. If you notice any of these symptoms in your newborn, tell the doctor promptly. Since untreated mental/mood problems (such as depression, anxiety, obsessive-compulsive disorder, panic disorder) can be a serious condition, do not stop using this medication unless directed by your doctor. If you are planning pregnancy, become pregnant, or think you may be pregnant, immediately discuss with your doctor the benefits and risks of using this medication during pregnancy. This medication passes into breast milk and may have undesirable effects on a nursing infant. Consult your doctor before breast-feeding. What should I know regarding pregnancy, nursing and administering Lexapro to children or the elderly?
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
## 2016-05-20 ENCOUNTER — Ambulatory Visit: Payer: No Typology Code available for payment source | Admitting: Physical Therapy

## 2016-05-21 ENCOUNTER — Ambulatory Visit: Payer: Medicaid Other | Attending: Internal Medicine | Admitting: Physician Assistant

## 2016-05-21 ENCOUNTER — Other Ambulatory Visit (HOSPITAL_COMMUNITY)
Admission: RE | Admit: 2016-05-21 | Discharge: 2016-05-21 | Disposition: A | Discharge status: Home / Self Care | Payer: Medicaid Other | Source: Ambulatory Visit | Attending: Internal Medicine | Admitting: Internal Medicine

## 2016-05-21 ENCOUNTER — Telehealth: Payer: Self-pay | Admitting: Internal Medicine

## 2016-05-21 ENCOUNTER — Encounter: Payer: Self-pay | Admitting: Physician Assistant

## 2016-05-21 VITALS — BP 111/77 | HR 72 | Temp 98.0°F | Resp 16 | Wt 144.6 lb

## 2016-05-21 DIAGNOSIS — N898 Other specified noninflammatory disorders of vagina: Secondary | ICD-10-CM | POA: Diagnosis present

## 2016-05-21 DIAGNOSIS — M419 Scoliosis, unspecified: Secondary | ICD-10-CM | POA: Insufficient documentation

## 2016-05-21 DIAGNOSIS — F329 Major depressive disorder, single episode, unspecified: Secondary | ICD-10-CM | POA: Diagnosis not present

## 2016-05-21 DIAGNOSIS — Z90710 Acquired absence of both cervix and uterus: Secondary | ICD-10-CM | POA: Insufficient documentation

## 2016-05-21 DIAGNOSIS — M549 Dorsalgia, unspecified: Secondary | ICD-10-CM | POA: Diagnosis not present

## 2016-05-21 DIAGNOSIS — L298 Other pruritus: Secondary | ICD-10-CM | POA: Insufficient documentation

## 2016-05-21 DIAGNOSIS — M797 Fibromyalgia: Secondary | ICD-10-CM | POA: Insufficient documentation

## 2016-05-21 DIAGNOSIS — Z113 Encounter for screening for infections with a predominantly sexual mode of transmission: Secondary | ICD-10-CM | POA: Diagnosis present

## 2016-05-21 DIAGNOSIS — F419 Anxiety disorder, unspecified: Secondary | ICD-10-CM | POA: Insufficient documentation

## 2016-05-21 MED ORDER — FLUCONAZOLE 150 MG PO TABS: 150.0000 mg | ORAL_TABLET | Freq: Once | ORAL | 0 refills | Status: DC

## 2016-05-21 MED ORDER — FLUCONAZOLE 150 MG PO TABS: 150.0000 mg | ORAL_TABLET | Freq: Once | ORAL | 0 refills | Status: AC

## 2016-05-21 NOTE — Progress Notes (Signed)
Katelyn Wade, is a 40 y.o. female  NFA:[id number]  ION:[id number]  DOB - 08/05/1975  Subjective:  Chief Complaint and HPI: Katelyn Wade is a 40 y.o. female here today for vaginal discharge, itching, and a denuded area of tender skin bt the vaginal area and rectum.  She has had a partial hysterectomy.  No pelvic pain or dysuria.  No N/V/D.  Prednisone is causing her to feel irritable and not really helping her pain.  She will be following up with Dr. Julien NordmannLangeland on that.  She wants to do STD testing that doesn't have to be done through blood work.   ROS:   Constitutional:  No f/c, No night sweats, No unexplained weight loss. EENT:  No vision changes, No blurry vision, No hearing changes. No mouth, throat, or ear problems.  Respiratory: No cough, No SOB Cardiac: No CP, no palpitations GI:  No abd pain, No N/V/D. GU: No Urinary s/sx Musculoskeletal: No joint pain Neuro: No headache, no dizziness, no motor weakness.  Skin: No rash Endocrine:  No polydipsia. No polyuria.  Psych: Denies SI/HI  No problems updated.  ALLERGIES: No Known Allergies  PAST MEDICAL HISTORY: Past Medical History:  Diagnosis Date  . Anxiety   . Back pain   . Depression   . Fibromyalgia   . Scoliosis     MEDICATIONS AT HOME: Prior to Admission medications   Medication Sig Start Date End Date Taking? Authorizing Provider  Amphetamine-Dextroamphetamine (ADDERALL PO) Take by mouth. Reported on 10/24/2015   Yes Historical Provider, MD  cyclobenzaprine (FLEXERIL) 10 MG tablet Take 1 tablet (10 mg total) by mouth 3 (three) times daily as needed for muscle spasms. 05/12/16  Yes Pete Glatterawn T Langeland, MD  DULoxetine (CYMBALTA) 20 MG capsule Take 20 mg by mouth daily. Reported on 10/24/2015   Yes Historical Provider, MD  escitalopram (LEXAPRO) 10 MG tablet Take 0.5 tablets (5 mg total) by mouth daily. Day 1- 4, take 1/2 tab (=0.5mg ), than take full dose 10mg  qday. 05/12/16  Yes Pete Glatterawn T Langeland, MD  predniSONE  (DELTASONE) 20 MG tablet Take 2 tablets (40 mg total) by mouth daily with breakfast. 05/12/16  Yes Pete Glatterawn T Langeland, MD  traMADol (ULTRAM) 50 MG tablet Take 1 tablet (50 mg total) by mouth every 8 (eight) hours as needed. 05/12/16  Yes Pete Glatterawn T Langeland, MD  Zolpidem Tartrate (AMBIEN PO) Take by mouth. Reported on 10/24/2015   Yes Historical Provider, MD  carisoprodol (SOMA) 350 MG tablet Take 350 mg by mouth 4 (four) times daily as needed for muscle spasms. Reported on 10/24/2015    Historical Provider, MD  fluconazole (DIFLUCAN) 150 MG tablet Take 1 tablet (150 mg total) by mouth once. 05/21/16 05/21/16  Anders SimmondsAngela M Brentlee Delage, PA-C  lisdexamfetamine (VYVANSE) 30 MG capsule Take 30 mg by mouth daily. Reported on 10/24/2015    Historical Provider, MD  OXYCODONE ER PO Take by mouth. Reported on 10/24/2015    Historical Provider, MD  traZODone (DESYREL) 50 MG tablet Take 50 mg by mouth at bedtime.    Historical Provider, MD  triamcinolone cream (KENALOG) 0.1 % Apply 1 application topically 2 (two) times daily. Do not apply to face Patient not taking: Reported on 05/21/2016 10/23/13   Ambrose FinlandValerie A Keck, NP  ZOLPIDEM TARTRATE PO Take by mouth. Reported on 10/24/2015    Historical Provider, MD     Objective:  EXAM:   Vitals:   05/21/16 1057  BP: 111/77  Pulse: 72  Resp: 16  Temp: 98  F (36.7 C)  TempSrc: Oral  SpO2: 98%  Weight: 144 lb 9.6 oz (65.6 kg)    General appearance : A&OX3. NAD. Non-toxic-appearing HEENT: Atraumatic and Normocephalic.  PERRLA. EOM intact.   Neck: supple, no JVD. No cervical lymphadenopathy. No thyromegaly Chest/Lungs:  Breathing-non-labored, Good air entry bilaterally, breath sounds normal without rales, rhonchi, or wheezing  CVS: S1 S2 regular, no murmurs, gallops, rubs  GU:  1 cm denuded skin R of labia majora that is TTP.  No ulceration or vesicles.  HSV swab taken.  External genitalia otherwise unremarkable.  Vaginal mucosa intact.  There is thick, whitish d/c present.  Swabs  taken.  Bimanual exam unremarkable.   Extremities: Bilateral Lower Ext shows no edema, both legs are warm to touch with = pulse throughout Neurology:  CN II-XII grossly intact, Non focal.   Psych:  TP linear. J/I WNL. Normal speech. Appropriate eye contact and affect.  Skin:  No Rash  Data Review Lab Results  Component Value Date   HGBA1C 5.2 11/01/2013     Assessment & Plan   1. Vaginal lesion Diflucan sent - Cervicovaginal ancillary only-HSV, GC/Chlam, WP sent   Patient have been counseled extensively about nutrition and exercise  F/up as planned with Dr. Julien NordmannLangeland.  The patient was given clear instructions to go to ER or return to medical center if symptoms don't improve, worsen or new problems develop. The patient verbalized understanding. The patient was told to call to get lab results if they haven't heard anything in the next week.     Georgian CoAngela Alecsander Hattabaugh, PA-C [facility] [place], [place] [phone]   05/21/2016, 11:20 AMPatient ID: Katelyn Wade, female   DOB: 08/14/1975, 40 y.o.   MRN: [id number]

## 2016-05-21 NOTE — Telephone Encounter (Signed)
[facility] in [facility] to specimen that was sent in today for pt  CB: [phone]

## 2016-05-21 NOTE — Addendum Note (Signed)
Addended by: Anders SimmondsMCCLUNG, Skylynne Schlechter M on: 05/21/2016 03:47 PM   Modules accepted: Orders

## 2016-05-21 NOTE — Progress Notes (Signed)
Pt is in the office today for possible yeast infection

## 2016-05-22 ENCOUNTER — Other Ambulatory Visit: Payer: Self-pay | Admitting: Physician Assistant

## 2016-05-22 ENCOUNTER — Telehealth: Payer: Self-pay | Admitting: Internal Medicine

## 2016-05-22 DIAGNOSIS — N760 Acute vaginitis: Principal | ICD-10-CM

## 2016-05-22 DIAGNOSIS — B9689 Other specified bacterial agents as the cause of diseases classified elsewhere: Secondary | ICD-10-CM

## 2016-05-22 LAB — CERVICOVAGINAL ANCILLARY ONLY
CHLAMYDIA, DNA PROBE: NEGATIVE
NEISSERIA GONORRHEA: NEGATIVE
Wet Prep (BD Affirm): POSITIVE — AB

## 2016-05-22 MED ORDER — FLUCONAZOLE 150 MG PO TABS: 150.0000 mg | ORAL_TABLET | Freq: Once | ORAL | 0 refills | Status: AC

## 2016-05-22 MED ORDER — METRONIDAZOLE 500 MG PO TABS: 500.0000 mg | ORAL_TABLET | Freq: Two times a day (BID) | ORAL | 0 refills | Status: DC

## 2016-05-22 NOTE — Telephone Encounter (Signed)
Please send script to [facility] [address]patient is not aware of name of prescription....   Please call patient when script is sent

## 2016-05-22 NOTE — Telephone Encounter (Signed)
Patient would like to speak with the doctor inregards to medication being sent to wrong pharmacy. Please follow up.

## 2016-05-22 NOTE — Telephone Encounter (Signed)
Will forward to angela 

## 2016-05-22 NOTE — Telephone Encounter (Signed)
Please call and find out where she would like the medication sent, and I can resend it.   Thanks,  Georgian CoAngela McClung

## 2016-05-22 NOTE — Telephone Encounter (Signed)
I sent her 2 prescriptions to [facility] [address]-1 for yeast and 1 for BV.  Her culture results only showed BV.  No chlamydia or gonorrhea.  The Herpes culture is still pending.

## 2016-05-25 ENCOUNTER — Telehealth: Payer: Self-pay

## 2016-05-25 LAB — HERPES SIMPLEX VIRUS CULTURE: ORGANISM ID, BACTERIA: DETECTED

## 2016-05-25 NOTE — Telephone Encounter (Signed)
Contacted pt to go over wet pre results pt is aware and doesn't have any questions or concerns

## 2016-05-26 ENCOUNTER — Ambulatory Visit: Payer: Medicaid Other | Attending: Internal Medicine

## 2016-05-26 DIAGNOSIS — G8929 Other chronic pain: Secondary | ICD-10-CM | POA: Diagnosis present

## 2016-05-26 DIAGNOSIS — M25652 Stiffness of left hip, not elsewhere classified: Secondary | ICD-10-CM | POA: Diagnosis present

## 2016-05-26 DIAGNOSIS — M62830 Muscle spasm of back: Secondary | ICD-10-CM | POA: Diagnosis present

## 2016-05-26 DIAGNOSIS — M2560 Stiffness of unspecified joint, not elsewhere classified: Secondary | ICD-10-CM | POA: Diagnosis present

## 2016-05-26 DIAGNOSIS — M25651 Stiffness of right hip, not elsewhere classified: Secondary | ICD-10-CM | POA: Insufficient documentation

## 2016-05-26 DIAGNOSIS — M5442 Lumbago with sciatica, left side: Secondary | ICD-10-CM | POA: Insufficient documentation

## 2016-05-26 DIAGNOSIS — M6281 Muscle weakness (generalized): Secondary | ICD-10-CM | POA: Insufficient documentation

## 2016-05-26 DIAGNOSIS — R293 Abnormal posture: Secondary | ICD-10-CM
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
## 2016-05-26 NOTE — Patient Instructions (Signed)
From cabinet PPT, LTR, single knee to chest, bridge   5-10 reps 2x/day hold 5 sec

## 2016-05-28 ENCOUNTER — Encounter (INDEPENDENT_AMBULATORY_CARE_PROVIDER_SITE_OTHER): Payer: Self-pay | Admitting: Orthopedic Surgery

## 2016-05-28 ENCOUNTER — Encounter: Payer: Self-pay | Admitting: Internal Medicine

## 2016-05-28 ENCOUNTER — Other Ambulatory Visit: Payer: Self-pay | Admitting: Internal Medicine

## 2016-05-28 ENCOUNTER — Ambulatory Visit: Payer: Medicaid Other | Attending: Internal Medicine | Admitting: Internal Medicine

## 2016-05-28 ENCOUNTER — Ambulatory Visit (INDEPENDENT_AMBULATORY_CARE_PROVIDER_SITE_OTHER): Payer: Medicaid Other | Admitting: Orthopedic Surgery

## 2016-05-28 VITALS — BP 92/55 | HR 86 | Temp 98.2°F | Resp 18 | Ht 62.0 in | Wt 145.2 lb

## 2016-05-28 DIAGNOSIS — G8929 Other chronic pain: Secondary | ICD-10-CM

## 2016-05-28 DIAGNOSIS — M5442 Lumbago with sciatica, left side: Secondary | ICD-10-CM | POA: Diagnosis not present

## 2016-05-28 DIAGNOSIS — M797 Fibromyalgia: Secondary | ICD-10-CM | POA: Insufficient documentation

## 2016-05-28 DIAGNOSIS — F329 Major depressive disorder, single episode, unspecified: Secondary | ICD-10-CM | POA: Insufficient documentation

## 2016-05-28 DIAGNOSIS — Z79899 Other long term (current) drug therapy: Secondary | ICD-10-CM | POA: Insufficient documentation

## 2016-05-28 DIAGNOSIS — J029 Acute pharyngitis, unspecified: Secondary | ICD-10-CM | POA: Insufficient documentation

## 2016-05-28 DIAGNOSIS — M419 Scoliosis, unspecified: Secondary | ICD-10-CM | POA: Diagnosis not present

## 2016-05-28 DIAGNOSIS — J069 Acute upper respiratory infection, unspecified: Secondary | ICD-10-CM | POA: Diagnosis not present

## 2016-05-28 MED ORDER — AMOXICILLIN-POT CLAVULANATE 875-125 MG PO TABS: 1.0000 | ORAL_TABLET | Freq: Two times a day (BID) | ORAL | 0 refills | Status: AC

## 2016-05-28 MED ORDER — OXYCODONE-ACETAMINOPHEN 5-325 MG PO TABS: 1.0000 | ORAL_TABLET | Freq: Every day | ORAL | 0 refills | Status: DC

## 2016-05-28 MED ORDER — DEXTROMETHORPHAN-BENZOCAINE 10-15 MG MT LOZG: LOZENGE | OROMUCOSAL | 0 refills | Status: DC
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
## 2016-05-28 NOTE — Progress Notes (Signed)
Katelyn Wade, is a 40 y.o. female  ZOX:[id number]  [id number]  DOB - 01/24/1976  Chief Complaint  Patient presents with  . URI       Subjective:   Katelyn CancerMarkeita Wipperfurth is a 40 y.o. female here today for major complaint of sore throat and painful swallowing for few days. Associated with cough productive of blood tinged sputum, rusty brown. She denies chest pain, denies shortness breath.  Patient has No headache, No chest pain, No abdominal pain - No Nausea, No new weakness tingling or numbness. She also need refill of her medications.   Problem  Acute Upper Respiratory Infection   ALLERGIES: No Known Allergies  PAST MEDICAL HISTORY: Past Medical History:  Diagnosis Date  . Anxiety   . Back pain   . Depression   . Fibromyalgia   . Scoliosis    MEDICATIONS AT HOME: Prior to Admission medications   Medication Sig Start Date End Date Taking? Authorizing Provider  Amphetamine-Dextroamphetamine (ADDERALL PO) Take by mouth. Reported on 10/24/2015   Yes Historical Provider, MD  carisoprodol (SOMA) 350 MG tablet Take 350 mg by mouth 4 (four) times daily as needed for muscle spasms. Reported on 10/24/2015   Yes Historical Provider, MD  cyclobenzaprine (FLEXERIL) 10 MG tablet Take 1 tablet (10 mg total) by mouth 3 (three) times daily as needed for muscle spasms. 05/12/16  Yes Pete Glatterawn T Langeland, MD  traMADol (ULTRAM) 50 MG tablet Take 1 tablet (50 mg total) by mouth every 8 (eight) hours as needed. 05/12/16  Yes Pete Glatterawn T Langeland, MD  Zolpidem Tartrate (AMBIEN PO) Take by mouth. Reported on 10/24/2015   Yes Historical Provider, MD  amoxicillin-clavulanate (AUGMENTIN) 875-125 MG tablet Take 1 tablet by mouth 2 (two) times daily. 05/28/16 06/02/16  Quentin Angstlugbemiga E Ana Woodroof, MD  Dextromethorphan-Benzocaine 10-15 MG LOZG Take 1 Lozg every 4 hours 05/28/16   Quentin Angstlugbemiga E Andrewjames Weirauch, MD    Objective:   Vitals:   05/28/16 1131  BP: (!) 92/55  Pulse: 86  Resp: 18  Temp: 98.2 F (36.8 C)  TempSrc: Oral    SpO2: 98%  Weight: 145 lb 3.2 oz (65.9 kg)  Height: 5\' 2"  (1.575 m)   Exam General appearance : Awake, alert, not in any distress. Speech Clear. Not toxic looking HEENT: Atraumatic and Normocephalic, pupils equally reactive to light and accomodation Neck: Supple, no JVD. No cervical lymphadenopathy.  Chest: Good air entry bilaterally, no added sounds  CVS: S1 S2 regular, no murmurs.  Abdomen: Bowel sounds present, Non tender and not distended with no gaurding, rigidity or rebound. Extremities: B/L Lower Ext shows no edema, both legs are warm to touch Neurology: Awake alert, and oriented X 3, CN II-XII intact, Non focal Skin: No Rash  Data Review Lab Results  Component Value Date   HGBA1C 5.2 11/01/2013    Assessment & Plan   1. Acute upper respiratory infection  - amoxicillin-clavulanate (AUGMENTIN) 875-125 MG tablet; Take 1 tablet by mouth 2 (two) times daily.  Dispense: 10 tablet; Refill: 0 - Dextromethorphan-Benzocaine 10-15 MG LOZG; Take 1 Lozg every 4 hours  Dispense: 84 each; Refill: 0  Patient have been counseled extensively about nutrition and exercise. Other issues discussed during this visit include: low cholesterol diet, weight control and daily exercise.   Return in about 6 months (around 11/26/2016) for Generalized Anxiety Disorder.  The patient was given clear instructions to go to ER or return to medical center if symptoms don't improve, worsen or new problems develop. The patient verbalized  understanding. The patient was told to call to get lab results if they haven't heard anything in the next week.   This note has been created with Education officer, environmentalDragon speech recognition software and smart phrase technology. Any transcriptional errors are unintentional.    Jeanann LewandowskyJEGEDE, Stephie Xu, MD, MHA, FACP, FAAP, CPE [facility] [address] [place], [place] [phone]   05/28/2016, 12:30 PM

## 2016-05-28 NOTE — Addendum Note (Signed)
Addended by: Rise PaganiniEAN, Daleigh Pollinger on: 05/28/2016 01:40 PM   Modules accepted: Orders

## 2016-05-28 NOTE — Patient Instructions (Signed)
Upper Respiratory Infection, Adult Most upper respiratory infections (URIs) are a viral infection of the air passages leading to the lungs. A URI affects the nose, throat, and upper air passages. The most common type of URI is nasopharyngitis and is typically referred to as "the common cold." URIs run their course and usually go away on their own. Most of the time, a URI does not require medical attention, but sometimes a bacterial infection in the upper airways can follow a viral infection. This is called a secondary infection. Sinus and middle ear infections are common types of secondary upper respiratory infections. Bacterial pneumonia can also complicate a URI. A URI can worsen asthma and chronic obstructive pulmonary disease (COPD). Sometimes, these complications can require emergency medical care and may be life threatening. What are the causes? Almost all URIs are caused by viruses. A virus is a type of germ and can spread from one person to another. What increases the risk? You may be at risk for a URI if:  You smoke.  You have chronic heart or lung disease.  You have a weakened defense (immune) system.  You are very young or very old.  You have nasal allergies or asthma.  You work in crowded or poorly ventilated areas.  You work in health care facilities or schools.  What are the signs or symptoms? Symptoms typically develop 2-3 days after you come in contact with a cold virus. Most viral URIs last 7-10 days. However, viral URIs from the influenza virus (flu virus) can last 14-18 days and are typically more severe. Symptoms may include:  Runny or stuffy (congested) nose.  Sneezing.  Cough.  Sore throat.  Headache.  Fatigue.  Fever.  Loss of appetite.  Pain in your forehead, behind your eyes, and over your cheekbones (sinus pain).  Muscle aches.  How is this diagnosed? Your health care provider may diagnose a URI by:  Physical exam.  Tests to check that your  symptoms are not due to another condition such as: ? Strep throat. ? Sinusitis. ? Pneumonia. ? Asthma.  How is this treated? A URI goes away on its own with time. It cannot be cured with medicines, but medicines may be prescribed or recommended to relieve symptoms. Medicines may help:  Reduce your fever.  Reduce your cough.  Relieve nasal congestion.  Follow these instructions at home:  Take medicines only as directed by your health care provider.  Gargle warm saltwater or take cough drops to comfort your throat as directed by your health care provider.  Use a warm mist humidifier or inhale steam from a shower to increase air moisture. This may make it easier to breathe.  Drink enough fluid to keep your urine clear or pale yellow.  Eat soups and other clear broths and maintain good nutrition.  Rest as needed.  Return to work when your temperature has returned to normal or as your health care provider advises. You may need to stay home longer to avoid infecting others. You can also use a face mask and careful hand washing to prevent spread of the virus.  Increase the usage of your inhaler if you have asthma.  Do not use any tobacco products, including cigarettes, chewing tobacco, or electronic cigarettes. If you need help quitting, ask your health care provider. How is this prevented? The best way to protect yourself from getting a cold is to practice good hygiene.  Avoid oral or hand contact with people with cold symptoms.  Wash your   hands often if contact occurs.  There is no clear evidence that vitamin C, vitamin E, echinacea, or exercise reduces the chance of developing a cold. However, it is always recommended to get plenty of rest, exercise, and practice good nutrition. Contact a health care provider if:  You are getting worse rather than better.  Your symptoms are not controlled by medicine.  You have chills.  You have worsening shortness of breath.  You have  brown or red mucus.  You have yellow or brown nasal discharge.  You have pain in your face, especially when you bend forward.  You have a fever.  You have swollen neck glands.  You have pain while swallowing.  You have white areas in the back of your throat. Get help right away if:  You have severe or persistent: ? Headache. ? Ear pain. ? Sinus pain. ? Chest pain.  You have chronic lung disease and any of the following: ? Wheezing. ? Prolonged cough. ? Coughing up blood. ? A change in your usual mucus.  You have a stiff neck.  You have changes in your: ? Vision. ? Hearing. ? Thinking. ? Mood. This information is not intended to replace advice given to you by your health care provider. Make sure you discuss any questions you have with your health care provider. Document Released: 12/02/2000 Document Revised: 02/09/2016 Document Reviewed: 09/13/2013 Elsevier Interactive Patient Education  2017 Elsevier Inc.  
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
## 2016-06-02 ENCOUNTER — Ambulatory Visit: Payer: Medicaid Other | Admitting: Physical Therapy

## 2016-06-02 DIAGNOSIS — M6281 Muscle weakness (generalized): Secondary | ICD-10-CM

## 2016-06-02 DIAGNOSIS — M5442 Lumbago with sciatica, left side: Principal | ICD-10-CM

## 2016-06-02 DIAGNOSIS — G8929 Other chronic pain: Secondary | ICD-10-CM

## 2016-06-02 DIAGNOSIS — M62830 Muscle spasm of back: Secondary | ICD-10-CM

## 2016-06-02 DIAGNOSIS — M25652 Stiffness of left hip, not elsewhere classified: Secondary | ICD-10-CM

## 2016-06-02 DIAGNOSIS — R293 Abnormal posture: Secondary | ICD-10-CM

## 2016-06-02 DIAGNOSIS — M25651 Stiffness of right hip, not elsewhere classified: Secondary | ICD-10-CM

## 2016-06-02 DIAGNOSIS — M2560 Stiffness of unspecified joint, not elsewhere classified: Secondary | ICD-10-CM

## 2016-06-02 NOTE — Therapy (Signed)
[facility] [address] [place], [place], [address] Phone: [phone]   Fax:  [phone]  Physical Therapy Treatment  Patient Details  Name: Katelyn Wade MRN: [id number] Date of Birth: 04/22/1976 Referring Provider: Dierdre Searlesawn Langeland, MD  Encounter Date: 06/02/2016      PT End of Session - 06/02/16 1545    Visit Number 2   Number of Visits 8   Date for PT Re-Evaluation 06/26/16   Authorization Type Medicaid ( She opted not to have Medicaid billed after eval)   PT Start Time 0331   PT Stop Time 0411  pt request to end treatment early    PT Time Calculation (min) 40 min      Past Medical History:  Diagnosis Date  . Anxiety   . Back pain   . Depression   . Fibromyalgia   . Scoliosis     Past Surgical History:  Procedure Laterality Date  . ABDOMINAL HYSTERECTOMY      There were no vitals filed for this visit.      Subjective Assessment - 06/02/16 1534    Subjective The exercises hurt    Pain Score 9    Pain Location Back   Pain Orientation Posterior   Aggravating Factors  all  activity    Pain Relieving Factors corsett                         [facility] Adult PT Treatment/Exercise - 06/02/16 0001      Lumbar Exercises: Stretches   Single Knee to Chest Stretch Limitations perfromed in sitting    Pelvic Tilt 5 reps   Standing Extension 5 reps   Prone on Elbows Stretch 1 rep;60 seconds   Piriformis Stretch Limitations modified figure 4 x 2 each side      Moist Heat Therapy   Number Minutes Moist Heat 10 Minutes   Moist Heat Location Lumbar Spine  seated                      PT Long Term Goals - 05/26/16 1507      PT LONG TERM GOAL #1   Title she will be independent with HEP issued   Time 4   Period Weeks     PT LONG TERM GOAL #2   Title She will report back pain eased 20% or more .    Time 4   Period Weeks   Status New     PT LONG TERM GOAL #3   Title She will  improve active knee hip flexion to 100 degrees and  assisted SLR to 40 degrees    Time 4   Period Weeks   Status New     PT LONG TERM GOAL #4   Title FOTO score to be improved to 70 % limited or better   Baseline 81% limited   Time 4   Period Weeks   Status New               Plan - 06/02/16 1613    Clinical Impression Statement Pt reports limited HEP compliance due to it causing incrased pain. She has difficulty with supine exercises due to feeling a knot in left lower lumbar in this position. Trial of prone lying to prone on elbows which increased left leg and mid back pain. Trial of standing lumbar extensions relieved some of her pain. Attempted pelvic rocking which pt reports increased popping in lumbar  spine. Pt required encouragement with all exercises and she requested to stop therex early. HMP offered to calm doen back pt. Pt prefers sitting for HMP. She is getting a new TENS unit today.    PT Next Visit Plan Review HEP , manual for STW stretching , add to HEP if able. MHP at end. Pt receptive to dry needling.    PT Home Exercise Plan Stetching LTR, Knee to Chest, PPT, bridging      Consulted and Agree with Plan of Care Patient      Patient will benefit from skilled therapeutic intervention in order to improve the following deficits and impairments:  Decreased range of motion, Difficulty walking, Pain, Decreased activity tolerance, Increased muscle spasms, Postural dysfunction, Decreased strength  Visit Diagnosis: Chronic bilateral low back pain with left-sided sciatica  Joint stiffness of spine  Abnormal posture  Spasm of muscle, back  Stiffness of left hip, not elsewhere classified  Stiffness of right hip, not elsewhere classified  Muscle weakness (generalized)     Problem List Patient Active Problem List   Diagnosis Date Noted  . Acute upper respiratory infection 05/28/2016    Sherrie Mustacheonoho, Marylon Verno McGee , PTA 06/02/2016, 4:24 PM  [facility] [address] [place], [place], [address] Phone: [phone]   Fax:  [phone]  Name: Katelyn Wade MRN: [id number] Date of Birth: 08/08/1975

## 2016-06-04 ENCOUNTER — Ambulatory Visit: Payer: Medicaid Other | Admitting: Physical Therapy

## 2016-06-09 ENCOUNTER — Ambulatory Visit: Payer: Medicaid Other | Admitting: Physical Therapy

## 2016-06-09 DIAGNOSIS — M5442 Lumbago with sciatica, left side: Secondary | ICD-10-CM | POA: Diagnosis not present

## 2016-06-09 DIAGNOSIS — R293 Abnormal posture: Secondary | ICD-10-CM

## 2016-06-09 DIAGNOSIS — M6281 Muscle weakness (generalized): Secondary | ICD-10-CM

## 2016-06-09 DIAGNOSIS — M62830 Muscle spasm of back: Secondary | ICD-10-CM

## 2016-06-09 DIAGNOSIS — G8929 Other chronic pain: Secondary | ICD-10-CM

## 2016-06-09 DIAGNOSIS — M25652 Stiffness of left hip, not elsewhere classified: Secondary | ICD-10-CM

## 2016-06-09 DIAGNOSIS — M25651 Stiffness of right hip, not elsewhere classified: Secondary | ICD-10-CM

## 2016-06-09 DIAGNOSIS — M2560 Stiffness of unspecified joint, not elsewhere classified: Secondary | ICD-10-CM

## 2016-06-09 NOTE — Therapy (Signed)
[facility] [address] [place], [place], [address] Phone: [phone]   Fax:  [phone]  Physical Therapy Treatment  Patient Details  Name: Katelyn Wade MRN: [id number] Date of Birth: 02/01/1976 Referring Provider: Dawn Langeland, MD  Encounter Date: 06/09/2016      PT End of Session - 06/09/16 0937    Visit Number 3   Number of Visits 8   Date for PT Re-Evaluation 06/26/16   Authorization Type Medicaid ( She opted not to have Medicaid billed after eval)   PT Start Time 0934   PT Stop Time 1012  Pt request to end treatment early    PT Time Calculation (min) 38 min      Past Medical History:  Diagnosis Date  . Anxiety   . Back pain   . Depression   . Fibromyalgia   . Scoliosis     Past Surgical History:  Procedure Laterality Date  . ABDOMINAL HYSTERECTOMY      There were no vitals filed for this visit.      Subjective Assessment - 06/09/16 0936    Subjective Increased pain and soreness after last visit.  I have not done any exercises.    Currently in Pain? Yes   Pain Score 8    Pain Location Back   Pain Orientation Posterior   Pain Radiating Towards More right leg     Aggravating Factors  stress, cold    Pain Relieving Factors heat                         [facility] Adult PT Treatment/Exercise - 06/09/16 0001      Lumbar Exercises: Stretches   Prone on Elbows Stretch Limitations prone lying progressing to prone on elbows after 1 minute, then hold 2 minutes      Lumbar Exercises: Aerobic   Stationary Bike Nustep L1 x 1 minute then c/o increased tightness/pulling in left leg and knee      Lumbar Exercises: Prone   Straight Leg Raises Limitations attempted hip extension and donkey kick, unable to lift leg and c/o increased lumbar pain.    Other Prone Lumbar Exercises hamstring curls with abdominal contraction x 10 each, encouragement provided   Other Prone Lumbar Exercises Glute squeezes  x 10, Hip IR/ ER AROM with knees bent x 10      Moist Heat Therapy   Number Minutes Moist Heat 10 Minutes   Moist Heat Location Cervical;Lumbar Spine  seated      Manual Therapy   Manual Therapy Soft tissue mobilization   Soft tissue mobilization Right glute max, medius, piriformis -tolerates light presure                 PT Education - 06/09/16 1118    Education provided Yes   Education Details HEP   Person(s) Educated Patient   Methods Explanation;Handout   Comprehension Verbalized understanding             PT Long Term Goals - 05/26/16 1507      PT LONG TERM GOAL #1   Title she will be independent with HEP issued   Time 4   Period Weeks     PT LONG TERM GOAL #2   Title She will report back pain eased 20% or more .    Time 4   Period Weeks   Status New     PT LONG TERM GOAL #3   Title She will   improve active knee hip flexion to 100 degrees and  assisted SLR to 40 degrees    Time 4   Period Weeks   Status New     PT LONG TERM GOAL #4   Title FOTO score to be improved to 70 % limited or better   Baseline 81% limited   Time 4   Period Weeks   Status New               Plan - 06/09/16 1011    Clinical Impression Statement Pt is not performing HEP. She attributes this to celebrating her daughter receiving a scholarship to college. Attempted Nustep with pt c/ o increased right neck and right leg pain so discontinued. Repeated prone positioning and added therex as tolerated. Pt requires increased time and encouragement to perform exercises. She missed her last appointment to try TPDN and is scheduled to attempt it again next visit. no goals met.    PT Next Visit Plan Review HEP- prefers sitting and prone , manual for STW stretching , add to HEP if able. MHP at end. Pt receptive to dry needling.    PT Home Exercise Plan Stetching LTR, Knee to Chest, PPT, bridging , prone lying with glute squeeze and hamstring curl    Consulted and Agree with Plan of  Care Patient      Patient will benefit from skilled therapeutic intervention in order to improve the following deficits and impairments:  Decreased range of motion, Difficulty walking, Pain, Decreased activity tolerance, Increased muscle spasms, Postural dysfunction, Decreased strength  Visit Diagnosis: Chronic bilateral low back pain with left-sided sciatica  Joint stiffness of spine  Abnormal posture  Spasm of muscle, back  Stiffness of left hip, not elsewhere classified  Stiffness of right hip, not elsewhere classified  Muscle weakness (generalized)     Problem List Patient Active Problem List   Diagnosis Date Noted  . Acute upper respiratory infection 05/28/2016    Donoho, Jessica McGee, PTA 06/09/2016, 11:31 AM  [facility] [address] [place], [place], [address] Phone: [phone]   Fax:  [phone]  Name: Katelyn Wade MRN: [id number] Date of Birth: 11/04/1975    

## 2016-06-09 NOTE — Patient Instructions (Signed)
   Perform only exercise # 1 and #2 from this series. Perform 10 reps each

## 2016-06-11 ENCOUNTER — Ambulatory Visit: Payer: Medicaid Other | Admitting: Physical Therapy

## 2016-06-11 ENCOUNTER — Ambulatory Visit (INDEPENDENT_AMBULATORY_CARE_PROVIDER_SITE_OTHER): Payer: Medicaid Other | Admitting: Orthopedic Surgery

## 2016-06-14 ENCOUNTER — Other Ambulatory Visit: Payer: Medicaid Other

## 2016-06-18 ENCOUNTER — Ambulatory Visit: Payer: Medicaid Other | Admitting: Physical Therapy

## 2016-06-18 ENCOUNTER — Other Ambulatory Visit: Payer: Medicaid Other

## 2016-06-24 ENCOUNTER — Ambulatory Visit: Payer: Medicaid Other | Admitting: Physical Therapy

## 2016-06-25 ENCOUNTER — Ambulatory Visit: Payer: Medicaid Other | Admitting: Physical Therapy

## 2016-06-26 ENCOUNTER — Ambulatory Visit: Payer: Medicaid Other | Admitting: Physical Therapy

## 2016-06-27 ENCOUNTER — Ambulatory Visit
Admission: RE | Admit: 2016-06-27 | Discharge: 2016-06-27 | Disposition: A | Discharge status: Home / Self Care | Payer: Medicaid Other | Source: Ambulatory Visit | Attending: Orthopedic Surgery | Admitting: Orthopedic Surgery

## 2016-06-27 DIAGNOSIS — G8929 Other chronic pain: Secondary | ICD-10-CM

## 2016-06-27 DIAGNOSIS — M5442 Lumbago with sciatica, left side: Principal | ICD-10-CM

## 2016-07-01 ENCOUNTER — Encounter (INDEPENDENT_AMBULATORY_CARE_PROVIDER_SITE_OTHER): Payer: Self-pay | Admitting: Orthopedic Surgery

## 2016-07-01 ENCOUNTER — Ambulatory Visit (INDEPENDENT_AMBULATORY_CARE_PROVIDER_SITE_OTHER): Payer: Medicaid Other | Admitting: Orthopedic Surgery

## 2016-07-01 ENCOUNTER — Telehealth: Payer: Self-pay | Admitting: Internal Medicine

## 2016-07-01 DIAGNOSIS — G8929 Other chronic pain: Secondary | ICD-10-CM | POA: Diagnosis not present

## 2016-07-01 DIAGNOSIS — M5441 Lumbago with sciatica, right side: Secondary | ICD-10-CM

## 2016-07-01 DIAGNOSIS — M5442 Lumbago with sciatica, left side: Secondary | ICD-10-CM

## 2016-07-01 DIAGNOSIS — M5440 Lumbago with sciatica, unspecified side: Principal | ICD-10-CM

## 2016-07-01 LAB — CBC WITH DIFFERENTIAL/PLATELET
Basophils Absolute: 0 cells/uL (ref 0–200)
Basophils Relative: 0 %
EOS PCT: 1 %
Eosinophils Absolute: 99 cells/uL (ref 15–500)
HCT: 40.3 % (ref 35.0–45.0)
HEMOGLOBIN: 13.4 g/dL (ref 11.7–15.5)
LYMPHS ABS: 1881 {cells}/uL (ref 850–3900)
Lymphocytes Relative: 19 %
MCH: 31.1 pg (ref 27.0–33.0)
MCHC: 33.3 g/dL (ref 32.0–36.0)
MCV: 93.5 fL (ref 80.0–100.0)
MPV: 11 fL (ref 7.5–12.5)
Monocytes Absolute: 693 cells/uL (ref 200–950)
Monocytes Relative: 7 %
NEUTROS ABS: 7227 {cells}/uL (ref 1500–7800)
Neutrophils Relative %: 73 %
Platelets: 270 10*3/uL (ref 140–400)
RBC: 4.31 MIL/uL (ref 3.80–5.10)
RDW: 12.6 % (ref 11.0–15.0)
WBC: 9.9 10*3/uL (ref 3.8–10.8)

## 2016-07-01 NOTE — Progress Notes (Signed)
Office Visit Note   Patient: Katelyn Wade           Date of Birth: 07/15/1975           MRN: [id number] Visit Date: 07/01/2016 Requested by: Pete Glatterawn T Langeland, MD [address] [place], [place] [address] PCP: Pete Glatterawn T Langeland, MD  Subjective: Chief Complaint  Patient presents with  . Lower Back - Pain    HPI Katelyn Wade is a 41 year old female with low back pain and she's here to review MRI scan of her lumbar spine.  That showed not much in terms of neural impingement or significant problems.  She still having a lot of low back pain.  She walks at times her "hips" give out and catch.  Reports pain laterally to anteriorly in the hip region.  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              Review of Systems All systems reviewed are negative as they relate to the chief complaint within the history of present illness.  Patient denies  fevers or chills.    Assessment & Plan: Visit Diagnoses:  1. Chronic bilateral low back pain with bilateral sciatica     Plan: Impression is general body pain with no definitive discrete orthopedic source.  Plan is to draw CBC differential sedimentation rate C-reactive protein and uric acid 80 mL he antibody and vitamin D level.  Also wearing a bone scan of her entire body.  If those scans are negative then really I have nothing else to offer her.  I'll call her with the results of those studies.  She may need to pursue neurological referral if these studies are negative  Follow-Up Instructions: No Follow-up on file.   Orders:  No orders of the defined types were placed in this encounter.  No orders of the defined types were placed in this encounter.     Procedures: No procedures performed   Clinical Data: No additional findings.  Objective: Vital Signs: There were no vitals taken for this visit.  Physical Exam   Constitutional: Patient appears well-developed HEENT:  Head: Normocephalic Eyes:EOM are normal Neck: Normal range of  motion Cardiovascular: Normal rate Pulmonary/chest: Effort normal Neurologic: Patient is alert Skin: Skin is warm Psychiatric: Patient has normal mood and affect    Ortho Exam orthopedic exam demonstrates no nerve root tension signs mild trochanteric tenderness bilaterally palpable pedal pulses good ankle dorsiflexion when flexion strength no definite paresthesias L1 S1 bilaterally some pain with forward lateral bending pre-normal gait alignment no muscle atrophy negative Babinski negative clonus  Specialty Comments:  No specialty comments available.  Imaging: No results found.   PMFS History: Patient Active Problem List   Diagnosis Date Noted  . Chronic bilateral low back pain with bilateral sciatica 07/01/2016  . Acute upper respiratory infection 05/28/2016   Past Medical History:  Diagnosis Date  . Anxiety   . Back pain   . Depression   . Fibromyalgia   . Scoliosis     Family History  Problem Relation Age of Onset  . Bipolar disorder Mother   . Fibromyalgia Mother     Past Surgical History:  Procedure Laterality Date  . ABDOMINAL HYSTERECTOMY     Social History   Occupational History  . Not on file.   Social History Main Topics  . Smoking status: Never Smoker  . Smokeless tobacco: Never Used  . Alcohol use No  . Drug use: No  . Sexual activity: Not  on file

## 2016-07-01 NOTE — Telephone Encounter (Signed)
Dr August Saucerean stated that patient needs to be referred to neurologist for severe back and leg pain. Please f/u.

## 2016-07-02 ENCOUNTER — Ambulatory Visit: Payer: Medicaid Other | Admitting: Physical Therapy

## 2016-07-02 LAB — URIC ACID: Uric Acid, Serum: 3.1 mg/dL (ref 2.5–7.0)

## 2016-07-02 LAB — CYCLIC CITRUL PEPTIDE ANTIBODY, IGG

## 2016-07-02 LAB — C-REACTIVE PROTEIN: CRP: 14.1 mg/L — ABNORMAL HIGH

## 2016-07-02 LAB — SEDIMENTATION RATE: Sed Rate: 4 mm/h (ref 0–20)

## 2016-07-02 NOTE — Telephone Encounter (Signed)
Will forward to pcp

## 2016-07-03 NOTE — Telephone Encounter (Signed)
I put in referral to neuro. May take 1-2 wks for appt. Thanks   Note/reviewed Dr Diamantina Providenceean's, ortho notes.

## 2016-07-06 ENCOUNTER — Telehealth (INDEPENDENT_AMBULATORY_CARE_PROVIDER_SITE_OTHER): Payer: Self-pay | Admitting: *Deleted

## 2016-07-06 NOTE — Telephone Encounter (Signed)
Pt called back and aware of appt 

## 2016-07-06 NOTE — Telephone Encounter (Signed)
[facility] Jan [facility] at 12[facility], pt is to arrive 15 mins early for registeration, left message on pt vm to return my call for appt information.

## 2016-07-07 ENCOUNTER — Encounter: Payer: Self-pay | Admitting: Internal Medicine

## 2016-07-07 ENCOUNTER — Ambulatory Visit: Payer: Medicaid Other | Attending: Internal Medicine | Admitting: Internal Medicine

## 2016-07-07 VITALS — BP 95/61 | HR 71 | Temp 98.2°F | Resp 16 | Wt 154.4 lb

## 2016-07-07 DIAGNOSIS — R300 Dysuria: Secondary | ICD-10-CM | POA: Insufficient documentation

## 2016-07-07 DIAGNOSIS — E079 Disorder of thyroid, unspecified: Secondary | ICD-10-CM | POA: Insufficient documentation

## 2016-07-07 DIAGNOSIS — F419 Anxiety disorder, unspecified: Secondary | ICD-10-CM | POA: Diagnosis not present

## 2016-07-07 DIAGNOSIS — M5441 Lumbago with sciatica, right side: Secondary | ICD-10-CM | POA: Diagnosis not present

## 2016-07-07 DIAGNOSIS — M797 Fibromyalgia: Secondary | ICD-10-CM | POA: Diagnosis not present

## 2016-07-07 DIAGNOSIS — G4700 Insomnia, unspecified: Secondary | ICD-10-CM | POA: Diagnosis not present

## 2016-07-07 DIAGNOSIS — M419 Scoliosis, unspecified: Secondary | ICD-10-CM | POA: Diagnosis not present

## 2016-07-07 DIAGNOSIS — G8929 Other chronic pain: Secondary | ICD-10-CM

## 2016-07-07 DIAGNOSIS — M5442 Lumbago with sciatica, left side: Secondary | ICD-10-CM | POA: Insufficient documentation

## 2016-07-07 DIAGNOSIS — F329 Major depressive disorder, single episode, unspecified: Secondary | ICD-10-CM | POA: Diagnosis not present

## 2016-07-07 DIAGNOSIS — G894 Chronic pain syndrome: Secondary | ICD-10-CM | POA: Insufficient documentation

## 2016-07-07 DIAGNOSIS — Z114 Encounter for screening for human immunodeficiency virus [HIV]: Secondary | ICD-10-CM | POA: Diagnosis not present

## 2016-07-07 DIAGNOSIS — Z131 Encounter for screening for diabetes mellitus: Secondary | ICD-10-CM | POA: Insufficient documentation

## 2016-07-07 DIAGNOSIS — Z8042 Family history of malignant neoplasm of prostate: Secondary | ICD-10-CM | POA: Diagnosis not present

## 2016-07-07 DIAGNOSIS — Z79899 Other long term (current) drug therapy: Secondary | ICD-10-CM | POA: Diagnosis not present

## 2016-07-07 DIAGNOSIS — Z1329 Encounter for screening for other suspected endocrine disorder: Secondary | ICD-10-CM | POA: Diagnosis not present

## 2016-07-07 LAB — POCT URINALYSIS DIPSTICK
BILIRUBIN UA: NEGATIVE
Blood, UA: NEGATIVE
Glucose, UA: NEGATIVE
Ketones, UA: NEGATIVE
LEUKOCYTES UA: NEGATIVE
NITRITE UA: NEGATIVE
PH UA: 6
Protein, UA: NEGATIVE
Spec Grav, UA: <=1.005
Urobilinogen, UA: 0.2

## 2016-07-07 LAB — CMP AND LIVER
ALBUMIN: 4.2 g/dL (ref 3.6–5.1)
ALK PHOS: 53 U/L (ref 33–115)
ALT: 14 U/L (ref 6–29)
AST: 17 U/L (ref 10–30)
BILIRUBIN TOTAL: 0.7 mg/dL (ref 0.2–1.2)
BUN: 8 mg/dL (ref 7–25)
Bilirubin, Direct: 0.2 mg/dL (ref <=0.2)
CALCIUM: 9.1 mg/dL (ref 8.6–10.2)
CO2: 26 mmol/L (ref 20–31)
CREATININE: 0.7 mg/dL (ref 0.50–1.10)
Chloride: 103 mmol/L (ref 98–110)
GLUCOSE: 85 mg/dL (ref 65–99)
Indirect Bilirubin: 0.5 mg/dL (ref 0.2–1.2)
Potassium: 4.1 mmol/L (ref 3.5–5.3)
Sodium: 140 mmol/L (ref 135–146)
TOTAL PROTEIN: 6.8 g/dL (ref 6.1–8.1)

## 2016-07-07 LAB — POCT GLYCOSYLATED HEMOGLOBIN (HGB A1C): Hemoglobin A1C: 5.1

## 2016-07-07 LAB — TSH: TSH: 1.16 m[IU]/L

## 2016-07-07 NOTE — Patient Instructions (Addendum)
Elavil - fyi read  Uses This medication is used to treat mental/mood problems such as depression. It may help improve mood and feelings of well-being, relieve anxiety and tension, help you sleep better, and increase your energy level. This medication belongs to a class of medications called tricyclic antidepressants. It works by affecting the balance of certain natural chemicals (neurotransmitters such as serotonin) in the brain. How to use Elavil Tablet Read the Medication Guide provided by your pharmacist before you start taking amitriptyline and each time you get a refill. If you have any questions, consult your doctor or pharmacist. Take this medication by mouth, usually 1 to 4 times daily or as directed by your doctor. If you take it only once a day, take it at bedtime to help reduce daytime sleepiness. The dosage is based on your medical condition and response to treatment. To reduce your risk of side effects (such as drowsiness, dry mouth, dizziness), your doctor may direct you to start this medication at a low dose and gradually increase your dose. Follow your doctor's instructions carefully. Take this medication regularly in order to get the most benefit from it. To help you remember, take it at the same time(s) each day. Do not increase your dose or use this drug more often or for longer than prescribed. Your condition will not improve any faster, and your risk of side effects will increase. It is important to continue taking this medication even if you feel well. Do not stop taking this medication without consulting your doctor. Some conditions may become worse when this drug is suddenly stopped. Also, you may experience symptoms such as mood swings, headache, tiredness, and sleep change. To prevent these symptoms while you are stopping treatment with this drug, your doctor may reduce your dose gradually. Consult your doctor or pharmacist for more details. Report any new or worsening symptoms  right away. This medication may not work right away. You may see some benefit within a week. However, it may take up to 4 weeks before you feel the full effect. Tell your doctor if your condition persists or worsens (such as your feelings of sadness get worse, or you have thoughts of suicide).   -   Chronic Back Pain When back pain lasts longer than 3 months, it is called chronic back pain.The cause of your back pain may not be known. Some common causes include:  Wear and tear (degenerative disease) of the bones, ligaments, or disks in your back.  Inflammation and stiffness in your back (arthritis). People who have chronic back pain often go through certain periods in which the pain is more intense (flare-ups). Many people can learn to manage the pain with home care. Follow these instructions at home: Pay attention to any changes in your symptoms. Take these actions to help with your pain: Activity  Avoid bending and activities that make the problem worse.  Do not sit or stand in one place for long periods of time.  Take brief periods of rest throughout the day. This will reduce your pain. Resting in a lying or standing position is usually better than sitting to rest.  When you are resting for longer periods, mix in some mild activity or stretching between periods of rest. This will help to prevent stiffness and pain.  Get regular exercise. Ask your health care provider what activities are safe for you.  Do not lift anything that is heavier than 10 lb (4.5 kg). Always use proper lifting technique, which includes:  Bending your knees.  Keeping the load close to your body.  Avoiding twisting. Managing pain  If directed, apply ice to the painful area. Your health care provider may recommend applying ice during the first 24-48 hours after a flare-up begins.  Put ice in a plastic bag.  Place a towel between your skin and the bag.  Leave the ice on for 20 minutes, 2-3 times per  day.  After icing, apply heat to the affected area as often as told by your health care provider. Use the heat source that your health care provider recommends, such as a moist heat pack or a heating pad.  Place a towel between your skin and the heat source.  Leave the heat on for 20-30 minutes.  Remove the heat if your skin turns bright red. This is especially important if you are unable to feel pain, heat, or cold. You may have a greater risk of getting burned.  Try soaking in a warm tub.  Take over-the-counter and prescription medicines only as told by your health care provider.  Keep all follow-up visits as told by your health care provider. This is important. Contact a health care provider if:  You have pain that is not relieved with rest or medicine. Get help right away if:  You have weakness or numbness in one or both of your legs or feet.  You have trouble controlling your bladder or your bowels.  You have nausea or vomiting.  You have pain in your abdomen.  You have shortness of breath or you faint. This information is not intended to replace advice given to you by your health care provider. Make sure you discuss any questions you have with your health care provider. Document Released: 07/16/2004 Document Revised: 10/17/2015 Document Reviewed: 11/26/2014 Elsevier Interactive Patient Education  2017 [employer].

## 2016-07-07 NOTE — Progress Notes (Signed)
Katelyn Wade, is a 41 y.o. female  [id number]  [id number]  DOB - 22-Jan-1976  Chief Complaint  Patient presents with  . Back Pain        Subjective:   Katelyn Wade is a 41 y.o. female here today for a follow up visit.  Last seen 05/12/16, w/ hx of chronic pain syndrome.  Recent seen ortho, Dr August Saucer, labs and mri lumbar no acute findings, has bone scan pending as well. Pt also pending Pain mgmt and neuro eval.  Of note, she was given a rx for Percocets at Dr August Saucer, but she asked for "something else to tide her over until she sees Pain mgmt."  Of note, she still has   A lot of ultram at home, initially took it and caused stomach upset so she stopped. She cannot tol codeine.    Of note, she does c/o of insomnia and stressors at home, but wants to hold off on taking any medications for anxiety/depression.  Denies si/hi/avh.  She takes [place] in past, but none recently for insomnia.   She notes a hx of pancreatic cancer and fibromyalgia in her family as well, and asked for full blood work.   Patient has No headache, No chest pain, No abdominal pain - No Nausea, No new weakness tingling or numbness, No Cough - SOB.  Here w/ her young teen-age dgt.  No problems updated.  ALLERGIES: No Known Allergies  PAST MEDICAL HISTORY: Past Medical History:  Diagnosis Date  . Anxiety   . Back pain   . Depression   . Fibromyalgia   . Scoliosis     MEDICATIONS AT HOME: Prior to Admission medications   Medication Sig Start Date End Date Taking? Authorizing Provider  cyclobenzaprine (FLEXERIL) 10 MG tablet take 1 tablet by mouth three times a day if needed for muscle spasm 06/01/16   Pete Glatter, MD  oxyCODONE-acetaminophen (PERCOCET/ROXICET) 5-325 MG tablet Take 1-2 tablets by mouth at bedtime. 05/28/16   Cammy Copa, MD  Zolpidem Tartrate (AMBIEN PO) Take by mouth. Reported on 10/24/2015    Historical Provider, MD     Objective:   Vitals:   07/07/16 1443  BP: 95/61    Pulse: 71  Resp: 16  Temp: 98.2 F (36.8 C)  TempSrc: Oral  SpO2: 100%  Weight: 154 lb 6.4 oz (70 kg)    Exam General appearance : Awake, alert, not in any distress. Speech Clear. Not toxic looking, pleasant, well-groomed.  Very mobile, able to get on examining table w/o difficulty. HEENT: Atraumatic and Normocephalic, pupils equally reactive to light. Neck: supple, no JVD.  Chest:Good air entry bilaterally, no added sounds. CVS: S1 S2 regular, no murmurs/gallups or rubs. Abdomen: Bowel sounds active, obese,  Non tender and not distended with no gaurding, rigidity or rebound. Extremities: B/L Lower Ext shows no edema, both legs are warm to touch Neurology: Awake alert, and oriented X 3, CN II-XII grossly intact, Non focal Skin:No Rash  Data Review Lab Results  Component Value Date   HGBA1C 5.1 07/07/2016   HGBA1C 5.2 11/01/2013    Depression screen [facility] 2/9 07/07/2016 05/21/2016 05/12/2016 11/01/2013 11/01/2013  Decreased Interest 1 3 3  - 3  Down, Depressed, Hopeless 3 2 3  0 3  PHQ - 2 Score 4 5 6  0 6  Altered sleeping 3 2 3  - -  Tired, decreased energy 3 2 3  - -  Change in appetite 3 2 3  - -  Feeling bad or failure about  yourself  3 3 3  - -  Trouble concentrating 3 3 3  - -  Moving slowly or fidgety/restless 0 (No Data) 3 - -  Suicidal thoughts 0 0 3 - -  PHQ-9 Score 19 17 27  - -      Assessment & Plan   1. Chronic bilateral low back pain with bilateral sciatica, suspect pain multifactorial, w/ possible component of anxiety /depression as well. - defer to pain mgmt and ortho, has bone scan pending. - CMP and Liver - pt has ultrams at home still, but recd taking it w/ food, may help w/ abd discomfort. - percocet - defer to Dr August Saucerean. - has neuro appt pending as well. - recd elevil 25 qhs, but pt wants to read about it more - infor provided  2. Diabetes mellitus screening - POCT glycosylated hemoglobin (Hb A1C)  3. Thyroid disorder screen - TSH  4. Encounter for  screening for HIV Pt request. - HIV antibody (with reflex)  5. Dysuria, mild - POCT urinalysis dipstick     Patient have been counseled extensively about nutrition and exercise  Return in about 3 months (around 10/05/2016), or if symptoms worsen or fail to improve.  The patient was given clear instructions to go to ER or return to medical center if symptoms don't improve, worsen or new problems develop. The patient verbalized understanding. The patient was told to call to get lab results if they haven't heard anything in the next week.   This note has been created with Education officer, environmentalDragon speech recognition software and smart phrase technology. Any transcriptional errors are unintentional.   Pete Glatterawn T Kharlie Bring, MD, MBA/MHA [facility] [place], [place] [phone]   07/07/2016, 3:39 PM

## 2016-07-08 ENCOUNTER — Ambulatory Visit: Payer: Medicaid Other | Admitting: Physical Therapy

## 2016-07-08 LAB — HIV ANTIBODY (ROUTINE TESTING W REFLEX): HIV: NONREACTIVE

## 2016-07-09 ENCOUNTER — Ambulatory Visit: Payer: Medicaid Other | Admitting: Neurology

## 2016-07-10 ENCOUNTER — Encounter (HOSPITAL_COMMUNITY): Payer: Medicaid Other

## 2016-07-10 ENCOUNTER — Telehealth: Payer: Self-pay

## 2016-07-10 NOTE — Telephone Encounter (Signed)
Contacted pt to go over lab results pt is aware of results and doesn't have any questions or concerns 

## 2016-07-13 ENCOUNTER — Ambulatory Visit: Payer: Medicaid Other | Admitting: Physical Therapy

## 2016-07-15 ENCOUNTER — Ambulatory Visit: Payer: Medicaid Other | Attending: Internal Medicine | Admitting: Physical Therapy

## 2016-07-15 DIAGNOSIS — M25651 Stiffness of right hip, not elsewhere classified: Secondary | ICD-10-CM | POA: Insufficient documentation

## 2016-07-15 DIAGNOSIS — M62830 Muscle spasm of back: Secondary | ICD-10-CM | POA: Diagnosis present

## 2016-07-15 DIAGNOSIS — G8929 Other chronic pain: Secondary | ICD-10-CM | POA: Diagnosis present

## 2016-07-15 DIAGNOSIS — M5442 Lumbago with sciatica, left side: Secondary | ICD-10-CM | POA: Insufficient documentation

## 2016-07-15 DIAGNOSIS — M6281 Muscle weakness (generalized): Secondary | ICD-10-CM | POA: Diagnosis present

## 2016-07-15 DIAGNOSIS — R293 Abnormal posture: Secondary | ICD-10-CM | POA: Insufficient documentation

## 2016-07-15 DIAGNOSIS — M25652 Stiffness of left hip, not elsewhere classified: Secondary | ICD-10-CM | POA: Diagnosis present

## 2016-07-15 DIAGNOSIS — M2560 Stiffness of unspecified joint, not elsewhere classified: Secondary | ICD-10-CM | POA: Diagnosis present

## 2016-07-15 NOTE — Therapy (Signed)
[facility] [address] [place], [place], [address] Phone: [phone]   Fax:  [phone]  Physical Therapy Treatment/ Re-certification  Patient Details  Name: Katelyn Wade MRN: [id number] Date of Birth: 02/15/1976 Referring Provider: Dierdre Searlesawn Langeland, MD  Encounter Date: 07/15/2016      PT End of Session - 07/15/16 1353    Visit Number 4   Number of Visits 8   Date for PT Re-Evaluation 08/12/16   Authorization Type Medicaid ( She opted not to have Medicaid billed after eval)   PT Start Time 1350  pt arrived 20 minutes late today   PT Stop Time 1422   PT Time Calculation (min) 32 min   Activity Tolerance Patient tolerated treatment well   Behavior During Therapy [facility] for tasks assessed/performed      Past Medical History:  Diagnosis Date  . Anxiety   . Back pain   . Depression   . Fibromyalgia   . Scoliosis     Past Surgical History:  Procedure Laterality Date  . ABDOMINAL HYSTERECTOMY      There were no vitals filed for this visit.      Subjective Assessment - 07/15/16 1355    Subjective "I am still having a good deal amount of pain, have been doing exercise just not the exercises given to me just ones i think it will  help"    Currently in Pain? Yes   Pain Score 7    Pain Location Back   Pain Orientation Posterior   Pain Descriptors / Indicators Aching   Pain Type Chronic pain   Pain Onset More than a month ago   Pain Frequency Constant   Aggravating Factors  stress, cold, anxiety, prolonged standing /bending    Pain Relieving Factors essential oils, massage self / legs.             [facility] PT Assessment - 07/15/16 1400      ROM / Strength   AROM / PROM / Strength Strength     AROM   Lumbar Flexion 70   Lumbar Extension 16   Lumbar - Right Side Bend 12   Lumbar - Left Side Bend 8     Strength   Strength Assessment Site Hip   Right/Left Hip Right;Left   Right Hip Flexion 3/5   Right Hip  ABduction 3/5   Right Hip ADduction 3/5   Left Hip Flexion 3/5   Left Hip ABduction 3/5   Left Hip ADduction 3/5                     [facility] Adult PT Treatment/Exercise - 07/15/16 0001      Moist Heat Therapy   Number Minutes Moist Heat 10 Minutes   Moist Heat Location Cervical;Lumbar Spine  in sitting     Manual Therapy   Manual therapy comments manual trigger point release over L lumbar paraspinals   given as HEP                PT Education - 07/15/16 1425    Education provided Yes   Education Details benefits of being consistent with HEP for benefits. manual trigger point release using tennis ball to promote muscle relaxation.    Person(s) Educated Patient   Methods Explanation;Verbal cues   Comprehension Verbalized understanding;Verbal cues required             PT Long Term Goals - 07/15/16 1410      PT LONG  TERM GOAL #1   Title she will be independent with HEP issued   Time 4   Period Weeks   Status On-going     PT LONG TERM GOAL #2   Title She will report back pain eased 20% or more .    Time 4   Period Weeks   Status On-going     PT LONG TERM GOAL #3   Title She will improve active knee hip flexion to 100 degrees and  assisted SLR to 40 degrees    Time 4   Period Weeks   Status On-going     PT LONG TERM GOAL #4   Title FOTO score to be improved to 70 % limited or better   Time 4   Period Weeks   Status On-going               Plan - 07/15/16 1432    Clinical Impression Statement she arrived 20 minutes late today. pt reports she hasn't been doing the HEP given by the PT's but has been trying to do other exercise, which when asked had difficulty reciting exercise. She demonstrates weakness in bil LE demonsgtrates limited effort required multiple cues to contract the muscles. due to pt running over 20 minutes late focused on reassessment and manual to calm down tightness.    Rehab Potential Fair   PT Frequency 1x / week    PT Duration 4 weeks   PT Treatment/Interventions Moist Heat;Passive range of motion;Patient/family education;Taping;Manual techniques;Therapeutic exercise;Dry needling   PT Next Visit Plan Review HEP- prefers sitting and prone , manual for STW stretching , add to HEP if able. MHP at end. Pt receptive to dry needling.    PT Home Exercise Plan Stetching LTR, Knee to Chest, PPT, bridging , prone lying with glute squeeze and hamstring curl    Consulted and Agree with Plan of Care Patient      Patient will benefit from skilled therapeutic intervention in order to improve the following deficits and impairments:  Decreased range of motion, Difficulty walking, Pain, Decreased activity tolerance, Increased muscle spasms, Postural dysfunction, Decreased strength  Visit Diagnosis: Chronic bilateral low back pain with left-sided sciatica - Plan: PT plan of care cert/re-cert  Joint stiffness of spine - Plan: PT plan of care cert/re-cert  Abnormal posture - Plan: PT plan of care cert/re-cert  Spasm of muscle, back - Plan: PT plan of care cert/re-cert  Stiffness of left hip, not elsewhere classified - Plan: PT plan of care cert/re-cert  Stiffness of right hip, not elsewhere classified - Plan: PT plan of care cert/re-cert  Muscle weakness (generalized) - Plan: PT plan of care cert/re-cert     Problem List Patient Active Problem List   Diagnosis Date Noted  . Chronic bilateral low back pain with bilateral sciatica 07/01/2016  . Acute upper respiratory infection 05/28/2016   Katelyn Wade PT, DPT, LAT, ATC  07/15/16  2:40 PM      [facility] [address] [place], [place], [address] Phone: [phone]   Fax:  [phone]  Name: Katelyn Wade MRN: [id number] Date of Birth: Jul 13, 1975

## 2016-07-17 ENCOUNTER — Encounter (HOSPITAL_COMMUNITY)
Admission: RE | Admit: 2016-07-17 | Discharge: 2016-07-17 | Disposition: A | Discharge status: Home / Self Care | Payer: Medicaid Other | Source: Ambulatory Visit | Attending: Orthopedic Surgery | Admitting: Orthopedic Surgery

## 2016-07-17 DIAGNOSIS — M5442 Lumbago with sciatica, left side: Secondary | ICD-10-CM | POA: Diagnosis present

## 2016-07-17 DIAGNOSIS — M5441 Lumbago with sciatica, right side: Secondary | ICD-10-CM | POA: Diagnosis present

## 2016-07-17 DIAGNOSIS — G8929 Other chronic pain: Secondary | ICD-10-CM | POA: Insufficient documentation

## 2016-07-17 MED ORDER — TECHNETIUM TC 99M MEDRONATE IV KIT
20.2000 | PACK | Freq: Once | INTRAVENOUS | Status: AC | PRN
  Administered 2016-07-17: 20.2 via INTRAVENOUS

## 2016-07-21 ENCOUNTER — Ambulatory Visit: Payer: Medicaid Other | Admitting: Physical Therapy

## 2016-07-21 DIAGNOSIS — G8929 Other chronic pain: Secondary | ICD-10-CM

## 2016-07-21 DIAGNOSIS — M6281 Muscle weakness (generalized): Secondary | ICD-10-CM

## 2016-07-21 DIAGNOSIS — M62830 Muscle spasm of back: Secondary | ICD-10-CM

## 2016-07-21 DIAGNOSIS — M25652 Stiffness of left hip, not elsewhere classified: Secondary | ICD-10-CM

## 2016-07-21 DIAGNOSIS — M5442 Lumbago with sciatica, left side: Principal | ICD-10-CM

## 2016-07-21 DIAGNOSIS — R293 Abnormal posture: Secondary | ICD-10-CM

## 2016-07-21 DIAGNOSIS — M25651 Stiffness of right hip, not elsewhere classified: Secondary | ICD-10-CM

## 2016-07-21 DIAGNOSIS — M2560 Stiffness of unspecified joint, not elsewhere classified: Secondary | ICD-10-CM

## 2016-07-21 NOTE — Therapy (Signed)
[facility] [address] [place], [place], [address] Phone: [phone]   Fax:  [phone]  Physical Therapy Treatment  Patient Details  Name: Katelyn Wade MRN: [id number] Date of Birth: 09/13/1975 Referring Provider: Dierdre Searles, MD  Encounter Date: 07/21/2016      PT End of Session - 07/21/16 1224    Visit Number 5   Number of Visits 8   Date for PT Re-Evaluation 08/12/16   PT Start Time 1152  pt was 7 minutes late   PT Stop Time 1243   PT Time Calculation (min) 51 min   Activity Tolerance Patient tolerated treatment well   Behavior During Therapy [facility] for tasks assessed/performed      Past Medical History:  Diagnosis Date  . Anxiety   . Back pain   . Depression   . Fibromyalgia   . Scoliosis     Past Surgical History:  Procedure Laterality Date  . ABDOMINAL HYSTERECTOMY      There were no vitals filed for this visit.      Subjective Assessment - 07/21/16 1152    Subjective "I did some of the stuff that was given last time which was painful but somewhat helpful"    Currently in Pain? Yes   Pain Score 7    Pain Location Back                         [facility] Adult PT Treatment/Exercise - 07/21/16 0001      Lumbar Exercises: Stretches   Active Hamstring Stretch 3 reps;30 seconds  contract/ relax 10 s hold     Moist Heat Therapy   Number Minutes Moist Heat 10 Minutes   Moist Heat Location Cervical;Lumbar Spine     Manual Therapy   Manual Therapy Joint mobilization   Joint Mobilization grade 2 PA PAIVM Lumbar spine; grade 2 R anterior innominate mobs   Soft tissue mobilization IASTM over R lumbar paraspinals and R glute med          Trigger Point Dry Needling - 07/21/16 1159    Consent Given? Yes   Education Handout Provided Yes  given previously   Muscles Treated Upper Body Longissimus   Longissimus Response Twitch response elicited;Palpable increased muscle length  R lumbar  paraspinals L1-L3                   PT Long Term Goals - 07/15/16 1410      PT LONG TERM GOAL #1   Title she will be independent with HEP issued   Time 4   Period Weeks   Status On-going     PT LONG TERM GOAL #2   Title She will report back pain eased 20% or more .    Time 4   Period Weeks   Status On-going     PT LONG TERM GOAL #3   Title She will improve active knee hip flexion to 100 degrees and  assisted SLR to 40 degrees    Time 4   Period Weeks   Status On-going     PT LONG TERM GOAL #4   Title FOTO score to be improved to 70 % limited or better   Time 4   Period Weeks   Status On-going               Plan - 07/21/16 1224    Clinical Impression Statement Katelyn Wade arrived 7 minutes late today. Educated  and performed DN on the R lumbar paraspinals followed with soft tissue work. pt continues to be ambiguous with pain when asked but during treatment reports pain around the R SIJ. following hamstring stretching and R anterior innominate mobs she reported relief of pain and tightness. continued mHP post session to calm down soreness.       Patient will benefit from skilled therapeutic intervention in order to improve the following deficits and impairments:  Decreased range of motion, Difficulty walking, Pain, Decreased activity tolerance, Increased muscle spasms, Postural dysfunction, Decreased strength  Visit Diagnosis: Chronic bilateral low back pain with left-sided sciatica  Joint stiffness of spine  Abnormal posture  Spasm of muscle, back  Stiffness of left hip, not elsewhere classified  Stiffness of right hip, not elsewhere classified  Muscle weakness (generalized)     Problem List Patient Active Problem List   Diagnosis Date Noted  . Chronic bilateral low back pain with bilateral sciatica 07/01/2016  . Acute upper respiratory infection 05/28/2016   Lulu RidingKristoffer Murray Durrell PT, DPT, LAT, ATC  07/21/16  12:32 PM      [facility] [address] [place], [place], [address] Phone: [phone]   Fax:  [phone]  Name: Katelyn Wade MRN: [id number] Date of Birth: 07/15/1975

## 2016-07-23 ENCOUNTER — Telehealth: Payer: Self-pay | Admitting: Internal Medicine

## 2016-07-23 DIAGNOSIS — N644 Mastodynia: Secondary | ICD-10-CM

## 2016-07-23 NOTE — Telephone Encounter (Signed)
Returned pt call pt states she is experiencing b/l breast pain pt states when she takes her bra off her breast feels heavy and they hurt. Pt states she feels comfortable with not wearing a bra. Pt states she has been ignoring the signs and symptoms but it came to the point to where she can't ignore it any longer. Pt states when she checks her breast she feels bumps and knots in her breast pt states she would like a mammogram done if possible. I informed pt that I will send this message to Dr. Julien NordmannLangeland and if she doesn't need you to make an appointment she will put the mammogram order in but it just depends. I informed pt that once [facility] to schedule an appointment. Pt states she understands and doesn't have any further questions and she appreciate me getting back to her so quick

## 2016-07-23 NOTE — Telephone Encounter (Signed)
Patient would like to speak to CMA regarding results discussed.  Please follow up

## 2016-07-24 ENCOUNTER — Ambulatory Visit: Payer: Medicaid Other | Attending: Internal Medicine | Admitting: Physical Therapy

## 2016-07-24 DIAGNOSIS — G8929 Other chronic pain: Secondary | ICD-10-CM | POA: Diagnosis present

## 2016-07-24 DIAGNOSIS — M62830 Muscle spasm of back: Secondary | ICD-10-CM | POA: Diagnosis present

## 2016-07-24 DIAGNOSIS — M5442 Lumbago with sciatica, left side: Secondary | ICD-10-CM | POA: Insufficient documentation

## 2016-07-24 DIAGNOSIS — M2560 Stiffness of unspecified joint, not elsewhere classified: Secondary | ICD-10-CM

## 2016-07-24 DIAGNOSIS — R293 Abnormal posture: Secondary | ICD-10-CM

## 2016-07-24 DIAGNOSIS — M25652 Stiffness of left hip, not elsewhere classified: Secondary | ICD-10-CM | POA: Diagnosis present

## 2016-07-24 DIAGNOSIS — M6281 Muscle weakness (generalized): Secondary | ICD-10-CM | POA: Diagnosis present

## 2016-07-24 DIAGNOSIS — M25651 Stiffness of right hip, not elsewhere classified: Secondary | ICD-10-CM

## 2016-07-24 NOTE — Therapy (Signed)
[facility] Outpatient Rehabilitation [facility] [address] [place], [place], [address] Phone: [phone]   Fax:  [phone]  Physical Therapy Treatment  Patient Details  Name: Katelyn Wade MRN: [id number] Date of Birth: Sep 28, 1975 Referring Provider: Dierdre Searles, MD  Encounter Date: 07/24/2016      PT End of Session - 07/24/16 0933    Visit Number 6   Number of Visits 8   Date for PT Re-Evaluation 08/12/16   PT Start Time 0847   PT Stop Time 0931   PT Time Calculation (min) 44 min   Activity Tolerance Patient tolerated treatment well   Behavior During Therapy [facility] for tasks assessed/performed      Past Medical History:  Diagnosis Date  . Anxiety   . Back pain   . Depression   . Fibromyalgia   . Scoliosis     Past Surgical History:  Procedure Laterality Date  . ABDOMINAL HYSTERECTOMY      There were no vitals filed for this visit.      Subjective Assessment - 07/24/16 0848    Subjective "I have not been feeling good with this cold weather. Dry Needling did not help."   Currently in Pain? Yes   Pain Score 8    Pain Location Back   Pain Orientation Posterior   Pain Descriptors / Indicators Aching   Pain Radiating Towards right leg and ankle   Aggravating Factors  cold, stress, prolonged standing   Pain Relieving Factors massage,                          [facility] Adult PT Treatment/Exercise - 07/24/16 0001      Lumbar Exercises: Stretches   Active Hamstring Stretch 3 reps;30 seconds  contract/relax 10 s hold     Lumbar Exercises: Aerobic   Stationary Bike Nustep L1 x 5 minute c/o pulling in leg when pedaling     Lumbar Exercises: Supine   Other Supine Lumbar Exercises supine marching  quit after 2 due to increased pain   Other Supine Lumbar Exercises modified table top 5 x 10 s hold     Moist Heat Therapy   Number Minutes Moist Heat 5 Minutes  decreased time due to pain   Moist Heat Location Cervical;Lumbar Spine      Manual Therapy   Manual Therapy Myofascial release   Joint Mobilization Grade 2 anterior innominate mobs   Soft tissue mobilization IASTM over R glute med   Myofascial Release manual trigger point release at R hamstring and R glute med                     PT Long Term Goals - 07/15/16 1410      PT LONG TERM GOAL #1   Title she will be independent with HEP issued   Time 4   Period Weeks   Status On-going     PT LONG TERM GOAL #2   Title She will report back pain eased 20% or more .    Time 4   Period Weeks   Status On-going     PT LONG TERM GOAL #3   Title She will improve active knee hip flexion to 100 degrees and  assisted SLR to 40 degrees    Time 4   Period Weeks   Status On-going     PT LONG TERM GOAL #4   Title FOTO score to be improved to 70 % limited or  better   Time 4   Period Weeks   Status On-going               Plan - 07/24/16 [id number]    Clinical Impression Statement Mrs. Thurmond ButtsWade reported increased pain today and that she did not think the dry needling relieved any tension last session. During exercise complained of increased pain. Reported IASTM and innominate mobs seemed to mildly improve pain. Could not perform supine marching or table top. Required support at ankles to tighten abs. Continued MHP post-session but left after 5 minutes due to pain.   pt got off MHP early stating "I am going home because I am in pain". She reports significant pain but reports no visual indications of pain or grimacing.   PT Next Visit Plan Review HEP- prefers sitting and prone , manual for STW stretching , add to HEP if able. MHP at end. Pt receptive to dry needling.    PT Home Exercise Plan Stetching LTR, Knee to Chest, PPT, bridging , prone lying with glute squeeze and hamstring curl    Consulted and Agree with Plan of Care Patient       Patient will benefit from skilled therapeutic intervention in order to improve the following deficits and impairments:   Decreased range of motion, Difficulty walking, Pain, Decreased activity tolerance, Increased muscle spasms, Postural dysfunction, Decreased strength  Visit Diagnosis: Chronic bilateral low back pain with left-sided sciatica  Joint stiffness of spine  Abnormal posture  Spasm of muscle, back  Stiffness of left hip, not elsewhere classified  Stiffness of right hip, not elsewhere classified  Muscle weakness (generalized)     Problem List Patient Active Problem List   Diagnosis Date Noted  . Chronic bilateral low back pain with bilateral sciatica 07/01/2016  . Acute upper respiratory infection 05/28/2016    Zada GirtHaley Rhianna Raulerson, SPT 07/24/2016, 12:16 PM  [facility] [address] [place], [place], [address] Phone: [phone]   Fax:  [phone]  Name: Baird CancerMarkeita Levick MRN: [id number] Date of Birth: 02/18/1976

## 2016-07-27 NOTE — Telephone Encounter (Signed)
Given her sx, I am less worried about breast cancer, especially due to fact she is young and bilateral nature of it. May be bumps she is noticing are mammary ducts, usually enlarge during periods. I put in order for [facility] breast mammogram, bilat, - please help her schedule.  Also need her to come in for urine pregnancy poc testing prior to study as well. Thanks.

## 2016-07-29 ENCOUNTER — Ambulatory Visit (INDEPENDENT_AMBULATORY_CARE_PROVIDER_SITE_OTHER): Payer: Medicaid Other | Admitting: Neurology

## 2016-07-29 ENCOUNTER — Encounter: Payer: Self-pay | Admitting: Neurology

## 2016-07-29 VITALS — BP 104/66 | HR 79 | Ht 62.75 in | Wt 155.0 lb

## 2016-07-29 DIAGNOSIS — M5441 Lumbago with sciatica, right side: Secondary | ICD-10-CM | POA: Diagnosis not present

## 2016-07-29 DIAGNOSIS — M5442 Lumbago with sciatica, left side: Secondary | ICD-10-CM

## 2016-07-29 DIAGNOSIS — G8929 Other chronic pain: Secondary | ICD-10-CM | POA: Diagnosis not present

## 2016-07-29 MED ORDER — NORTRIPTYLINE HCL 10 MG PO CAPS: ORAL_CAPSULE | ORAL | 3 refills | Status: DC
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
## 2016-07-29 NOTE — Patient Instructions (Signed)
    We will check blood work today, and start nortriptyline at night for pain and sleep.    Pamelor (nortriptyline) is an antidepressant medication that has many uses that may include headache, whiplash injuries, or for peripheral neuropathy pain. Side effects may include drowsiness, dry mouth, blurred vision, or constipation. As with any antidepressant medication, worsening depression may occur. If you had any significant side effects, please call our office. The full effects of this medication may take 7-10 days after starting the drug, or going up on the dose.

## 2016-07-30 ENCOUNTER — Ambulatory Visit: Payer: Medicaid Other | Admitting: Physical Therapy

## 2016-07-30 DIAGNOSIS — M25651 Stiffness of right hip, not elsewhere classified: Secondary | ICD-10-CM

## 2016-07-30 DIAGNOSIS — M5442 Lumbago with sciatica, left side: Principal | ICD-10-CM

## 2016-07-30 DIAGNOSIS — M25652 Stiffness of left hip, not elsewhere classified: Secondary | ICD-10-CM

## 2016-07-30 DIAGNOSIS — M6281 Muscle weakness (generalized): Secondary | ICD-10-CM

## 2016-07-30 DIAGNOSIS — G8929 Other chronic pain: Secondary | ICD-10-CM

## 2016-07-30 DIAGNOSIS — M62830 Muscle spasm of back: Secondary | ICD-10-CM

## 2016-07-30 DIAGNOSIS — M2560 Stiffness of unspecified joint, not elsewhere classified: Secondary | ICD-10-CM

## 2016-07-30 DIAGNOSIS — R293 Abnormal posture: Secondary | ICD-10-CM

## 2016-07-30 NOTE — Telephone Encounter (Signed)
Pt returning call from nurse She apologizes saying she was in physical therapy when first called

## 2016-07-30 NOTE — Therapy (Addendum)
[place], [place], [address] Phone: [phone]   Fax:  [phone]  Physical Therapy Treatment / discharge note  Patient Details  Name: Katelyn Wade MRN: [id number] Date of Birth: Nov 09, 1975 Referring Provider: Lottie Mussel, MD  Encounter Date: 07/30/2016      PT End of Session - 07/30/16 5885    Visit Number 7   Number of Visits 8   Date for PT Re-Evaluation 08/12/16   Authorization Type Medicaid ( She opted not to have Medicaid billed after eval)   PT Start Time [id number]  pt arrived 12 minutes late today   PT Stop Time 0851   PT Time Calculation (min) 39 min   Activity Tolerance Patient tolerated treatment well   Behavior During Therapy [facility] for tasks assessed/performed      Past Medical History:  Diagnosis Date  . Anxiety   . Back pain   . Depression   . Fibromyalgia   . Scoliosis     Past Surgical History:  Procedure Laterality Date  . ABDOMINAL HYSTERECTOMY    . ECTOPIC PREGNANCY SURGERY      There were no vitals filed for this visit.      Subjective Assessment - 07/30/16 0812    Subjective "The mornings are rough getting up and getting everything moving, having neck pain which feels like a ton of bricks and I have a HA today". pt reported getting a new medication from the neurologist which she reported increased soreness.   Pain Score 8    Pain Location Back   Pain Orientation Posterior   Pain Descriptors / Indicators Aching;Shooting;Numbness   Pain Onset More than a month ago   Pain Frequency Constant                         [facility] Adult PT Treatment/Exercise - 07/30/16 0820      Moist Heat Therapy   Number Minutes Moist Heat 15 Minutes   Moist Heat Location Cervical;Lumbar Spine     Manual Therapy   Joint Mobilization grade 1 PA L1-L5 mobilizations    Soft tissue mobilization IASTM over the Lumbar paraspinals on the R          Trigger Point Dry  Needling - 07/30/16 0836    Muscles Treated Upper Body Longissimus   Longissimus Response --  attempted at L2-L3 pt reported increased soreness, halted tx              PT Education - 07/30/16 0911    Education provided Yes   Education Details educated pt that inorder to treat or apply modalities to her shoulder hat we will need an updated referral from her MD to include shoulder into her current POC.    Person(s) Educated Patient   Methods Explanation   Comprehension Verbalized understanding             PT Long Term Goals - 07/15/16 1410      PT LONG TERM GOAL #1   Title she will be independent with HEP issued   Time 4   Period Weeks   Status On-going     PT LONG TERM GOAL #2   Title She will report back pain eased 20% or more .    Time 4   Period Weeks   Status On-going     PT LONG TERM GOAL #3   Title She will improve active knee hip flexion to 100 degrees and  assisted SLR to 40 degrees    Time 4   Period Weeks   Status On-going     PT LONG TERM GOAL #4   Title FOTO score to be improved to 70 % limited or better   Time 4   Period Weeks   Status On-going               Plan - 07/30/16 6599    Clinical Impression Statement Katelyn Wade arrived 12 minutes late today. reports seeing a Nuerologist yesterday but had difficulty reporting what was done. pt reported pain was an 8/10 today (based on previous visits where PT stated 10/10 was a Emergency department visit). special testing was positive for inorganic finidings with lack of L hip extension with attempted R SLR, and significant / inconsistent pain response with gentle calf/heel tap testing that pt reported shot pain from ther low back up into her left shoulder. pt reported previous the DN didn't help but requested it again today. Attempted DN which pt demonstrated signifcant pain response at L2 on the R so treatment was halted and soft tissue work and grade 1 mobilizations was performed followed with  MHP. Plan to refer pt back to her evaluating physical therapist and due to being inappropriate for DN treatment.   pt had difficulty moving LEs throughout treatment/ testing.DN pain response she demo no diffuclty lifting and holding her legs up off bolster for a prolonged period of time;end of session she demonstrated extreme difficulty lifting or moving her legs.    PT Next Visit Plan refer back to evaluating PT, soft tissue work, strengtheing, modalities as indicated.    Consulted and Agree with Plan of Care Patient      Patient will benefit from skilled therapeutic intervention in order to improve the following deficits and impairments:  Decreased range of motion, Difficulty walking, Pain, Decreased activity tolerance, Increased muscle spasms, Postural dysfunction, Decreased strength  Visit Diagnosis: Chronic bilateral low back pain with left-sided sciatica  Joint stiffness of spine  Abnormal posture  Spasm of muscle, back  Stiffness of left hip, not elsewhere classified  Stiffness of right hip, not elsewhere classified  Muscle weakness (generalized)     Problem List Patient Active Problem List   Diagnosis Date Noted  . Chronic bilateral low back pain with bilateral sciatica 07/01/2016  . Acute upper respiratory infection 05/28/2016   Starr Lake PT, DPT, LAT, ATC  07/30/16  9:13 AM      [facility] [address] [place], [place], [address] Phone: [phone]   Fax:  [phone]  Name: Katelyn Wade MRN: [id number] Date of Birth: 11-26-1975       PHYSICAL THERAPY DISCHARGE SUMMARY  Visits from Start of Care: 7  Current functional level related to goals / functional outcomes: See goals   Remaining deficits: Unknown due to pt not returning   Education / Equipment: HEP  Plan: Patient agrees to discharge.  Patient goals were not met. Patient is being discharged due to not returning since the last  visit.  ?????      Renell Coaxum PT, DPT, LAT, ATC  08/25/16  1:40 PM

## 2016-07-30 NOTE — Telephone Encounter (Signed)
Contacted to make aware of the response from Dr. Julien NordmannLangeland pt didn't answer lvm asking pt to give me a call back

## 2016-07-30 NOTE — Telephone Encounter (Signed)
Returned pt call pt didn't answer lvm asking pt to give me a call at her earliest convenience  

## 2016-07-31 ENCOUNTER — Other Ambulatory Visit: Payer: Self-pay

## 2016-07-31 ENCOUNTER — Ambulatory Visit: Payer: Medicaid Other | Admitting: Physical Therapy

## 2016-07-31 DIAGNOSIS — N644 Mastodynia: Secondary | ICD-10-CM

## 2016-07-31 NOTE — Telephone Encounter (Signed)
Pt returned call  And went over Dr. Julien NordmannLangeland response. Pt has schedule an appointment to see Dr. Julien NordmannLangeland for the breast pain. Pt canceled appointment because she is going to get the ultrasound done. [facility] spoke with Sherlynn CarbonLastisha we will need to order the right and left breast ultrasound. I put the order in epic an signed it using the diagnose code for breast pain. Per Debbe OdeaLatisha the breast pain Dx code was okay to use even though the other Dx code was breast pain in female. Have to call [employer] and do a pre-authorization for the imagine because pt has medicaid. Will be calling [employer] on Monday pt is schedule for imagine on Friday Aug 07, 2016 @1pm  and pt is aware

## 2016-07-31 NOTE — Telephone Encounter (Incomplete)
Pt returned call

## 2016-08-03 ENCOUNTER — Ambulatory Visit: Payer: Medicaid Other

## 2016-08-03 ENCOUNTER — Ambulatory Visit: Payer: Medicaid Other | Admitting: Internal Medicine

## 2016-08-03 NOTE — Telephone Encounter (Signed)
Ok. Thank you.

## 2016-08-06 ENCOUNTER — Telehealth: Payer: Self-pay | Admitting: *Deleted

## 2016-08-06 LAB — RHEUMATOID FACTOR

## 2016-08-06 LAB — ANGIOTENSIN CONVERTING ENZYME: ANGIO CONVERT ENZYME: 31 U/L (ref 14–82)

## 2016-08-06 LAB — ANA W/REFLEX: ANA: NEGATIVE

## 2016-08-06 LAB — VITAMIN B12: VITAMIN B 12: 1061 pg/mL (ref 232–1245)

## 2016-08-06 LAB — B. BURGDORFI ANTIBODIES

## 2016-08-06 LAB — VITAMIN D 25 HYDROXY (VIT D DEFICIENCY, FRACTURES): VIT D 25 HYDROXY: 10.1 ng/mL — AB (ref 30.0–100.0)

## 2016-08-06 NOTE — Telephone Encounter (Signed)
Called and spoke to pt about results per CW,MD note. She verbalized understanding.  Added Vit D to pt med list.  Patient is not comfortable going back to physical therapy at this time. She is wanting to know if there are other options Dr Anne Hahnwillis suggests. She also wants to know if Dr Anne HahnWillis thinks she has fibromyalgia. Advised I will send him a message. She verbalized understanding.

## 2016-08-06 NOTE — Telephone Encounter (Signed)
I called the patient. The vitamin D level was very low, she will get on vitamin D supplementation. This may improve some of her neuromuscular symptoms, I doubt that it will help everything.  The patient likely does have fibromyalgia. I have encouraged her to continue with physical therapy, she indicates that she did not want to go back because the dry needling caused discomfort. She does not have to have dry needling, she can get standard physical therapy.

## 2016-08-06 NOTE — Telephone Encounter (Signed)
Pt. Called requesting to speak with her nurse. Pt states she spoke with pt. Last week and the nurse was suppose to call her back. Please f/u

## 2016-08-06 NOTE — Telephone Encounter (Signed)
-----   Message from York Spanielharles K Willis, MD sent at 08/06/2016  2:42 PM EST ----- Blood work is unremarkable with exception that the vitamin D level is low, will need to go on oral supplementation, would start on 5000 IU daily. ----- Message ----- From: Nell RangeInterface, [facility] Lab Results In Sent: 08/05/2016   9:40 AM To: York Spanielharles K Willis, MD

## 2016-08-06 NOTE — Telephone Encounter (Signed)
Returned pt call pt is aware that we will be doing the mammogram first instead of ordering the us as well. Pt states it does make sense informed pt that if the mammogram shows something then we will ordering the us but if not then we wont. Pt states she understands and doesn't have any questions or concerns

## 2016-08-07 ENCOUNTER — Ambulatory Visit
Admission: RE | Admit: 2016-08-07 | Discharge: 2016-08-07 | Disposition: A | Discharge status: Home / Self Care | Payer: Medicaid Other | Source: Ambulatory Visit | Attending: Internal Medicine | Admitting: Internal Medicine

## 2016-08-07 ENCOUNTER — Other Ambulatory Visit: Payer: Medicaid Other

## 2016-08-07 ENCOUNTER — Ambulatory Visit: Payer: Medicaid Other

## 2016-08-07 DIAGNOSIS — N644 Mastodynia: Secondary | ICD-10-CM

## 2016-08-12 ENCOUNTER — Telehealth: Payer: Self-pay

## 2016-08-12 NOTE — Telephone Encounter (Signed)
Contacted pt to go over MM results pt is aware or results. Pt states she has some documents for Dr. Julien NordmannLangeland to fill out and would it be okay if she could drop them off to me. I informed pt that it would be okay if and I informed pt that Dr. Julien NordmannLangeland is not in the office but she will be back on Monday. Pt states that it is fine she is not in a rush and she will drop them off today

## 2016-09-14 ENCOUNTER — Telehealth: Payer: Self-pay | Admitting: Internal Medicine

## 2016-09-14 NOTE — Telephone Encounter (Signed)
Patient requesting to speak to nurse concerning disability paperwork that was recently completed by PCP

## 2016-09-14 NOTE — Telephone Encounter (Signed)
I reviewed most recent PT notes.  Neuro note on 07/29/16 which showed objective clinical exam was norma, wkup to r/o connective tissue d/o unremarkable about vit d def which she was started trx for.  ddx fibromyalgia high in her differential dx at this time, and neuro tried her on nortriptyline.  Ortho notes on 07/01/16 also reviewed, and exam demonstrated no definitive negative findings either.   We have been unable to definitively find the cause of her chronic pains, high in ddx is fibromyalgia. 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  Bone scan was neg as well.   I am unable to make changes on my documentation at this time. If she would like, she can make f/u w/ ortho w/ the paperwk there to see if there has been any changes to exam since I last saw her.  Would recd continued PT eval and treatment.  This was d/w Cma, who will f/u w/ pt.

## 2016-09-14 NOTE — Telephone Encounter (Signed)
Pt states she feels that Dr. Julien NordmannLangeland didn't look over her chart notes for rehab and physical therapy. Pt states when she took the form to her attorney she even expressed how she felt the Dr. Julien NordmannLangeland didn't look over her note. Pt states she would like Dr. Julien NordmannLangeland to review her chart notes and if it possible could she refill the form out. Pt states if she needs to set up an appointment then she will.

## 2016-10-30 ENCOUNTER — Ambulatory Visit: Payer: Medicaid Other

## 2016-11-02 ENCOUNTER — Ambulatory Visit: Payer: Medicaid Other | Attending: Internal Medicine | Admitting: Internal Medicine

## 2016-11-02 ENCOUNTER — Encounter: Payer: Self-pay | Admitting: Internal Medicine

## 2016-11-02 VITALS — BP 102/69 | HR 83 | Temp 98.4°F | Resp 16 | Wt 163.8 lb

## 2016-11-02 DIAGNOSIS — M797 Fibromyalgia: Secondary | ICD-10-CM | POA: Diagnosis not present

## 2016-11-02 DIAGNOSIS — L209 Atopic dermatitis, unspecified: Secondary | ICD-10-CM | POA: Insufficient documentation

## 2016-11-02 DIAGNOSIS — G8929 Other chronic pain: Secondary | ICD-10-CM | POA: Diagnosis not present

## 2016-11-02 DIAGNOSIS — M5442 Lumbago with sciatica, left side: Secondary | ICD-10-CM | POA: Diagnosis not present

## 2016-11-02 DIAGNOSIS — J069 Acute upper respiratory infection, unspecified: Secondary | ICD-10-CM | POA: Diagnosis present

## 2016-11-02 DIAGNOSIS — J302 Other seasonal allergic rhinitis: Secondary | ICD-10-CM | POA: Diagnosis not present

## 2016-11-02 DIAGNOSIS — M5441 Lumbago with sciatica, right side: Secondary | ICD-10-CM | POA: Insufficient documentation

## 2016-11-02 MED ORDER — TRIAMCINOLONE ACETONIDE 0.5 % EX OINT: 1.0000 "application " | TOPICAL_OINTMENT | Freq: Two times a day (BID) | CUTANEOUS | 0 refills | Status: DC

## 2016-11-02 MED ORDER — FLUTICASONE PROPIONATE 50 MCG/ACT NA SUSP: 2.0000 | Freq: Every day | NASAL | 6 refills | Status: AC

## 2016-11-02 MED ORDER — AMOXICILLIN-POT CLAVULANATE 875-125 MG PO TABS: 1.0000 | ORAL_TABLET | Freq: Two times a day (BID) | ORAL | 0 refills | Status: DC

## 2016-11-02 MED ORDER — LORATADINE 10 MG PO TABS: 10.0000 mg | ORAL_TABLET | Freq: Every day | ORAL | 11 refills | Status: AC

## 2016-11-02 NOTE — Patient Instructions (Addendum)
Only fill the antibiotics if not better in 4-5 days, take it with probiotics to prevent diarrhea.  Please make appoitment with physical therapist again, as well as Neuro, Dr Anne HahnWillis. I put in referral for Pain Management doctor as well.   Nasal Allergies Nasal allergies are a reaction to allergens in the air. Allergens are tiny specks (particles) in the air that cause your body to have an allergic reaction. Nasal allergies are not passed from person to person (contagious). They cannot be cured, but they can be controlled. Common causes of nasal allergies include:  Pollen from grasses, trees, and weeds.  House dust mites.  Pet dander.  Mold. Follow these instructions at home:  Avoid the allergen that is causing your symptoms, if you can.  Keep windows closed. If possible, use air conditioning when there is a lot of pollen in the air.  Do not use fans in your home.  Do not hang clothes outside to dry.  Wear sunglasses to keep pollen out of your eyes.  Wash your hands right away after you touch household pets.  Take over-the-counter and prescription medicines only as told by your doctor.  Keep all follow-up visits as told by your doctor. This is important. Contact a doctor if:  You have a fever.  You have a cough that does not go away (is persistent).  You start to make whistling sounds when you breathe (wheeze).  Your symptoms do not get better with treatment.  You have thick fluid coming from your nose.  You start to have nosebleeds. Get help right away if:  Your tongue or your lips are swollen.  You have trouble breathing.  You feel light-headed or you feel like you are going to pass out (faint).  You have cold sweats. This information is not intended to replace advice given to you by your health care provider. Make sure you discuss any questions you have with your health care provider. Document Released: 10/08/2010 Document Revised: 11/14/2015 Document  Reviewed: 12/19/2014 Elsevier Interactive Patient Education  2017 [employer]

## 2016-11-02 NOTE — Progress Notes (Addendum)
Katelyn Wade, is a 41 y.o. female  ZOX:[id number]  WJX:[id number]  DOB - 1976-06-13  Chief Complaint  Patient presents with  . URI        Subjective:   Katelyn Wade is a 41 y.o. female here today for a follow up visit, last seen 07/07/16 for chronic bilat lower back pain and likely fibromyalgia. She has had extensive wkup w/ Orthopaedics and no surgical intervention recd. She has seen neurology who felt this was fibromyalgia, and recd nortriptyline. Pt states she tried taking it but the pills made her feel off so not taking it regularly.  She is amendable to retrying it again and will make f/u appt w/ Neurology. She notes that PT did help with her sxs some, but caused severe irritation/pain when they tried the TENs.  She wants to continue PT and will ask them not to use Tens unit next time.  Of note, she c/o of sinus pressure (mainly facial) x 2 wks, more neck aches, and feels her glands are swollen, more so on left side of neck.  She has been trying to gargle w/ salt water and listerine w/o much improvement.  c/o of productive cough at times, clear phlegm.  No fevers, but feels subj. Chills.   Co of rash under bilat axilla as well, thought perhaps due to new shirt she wore that she did not wash prior (due to [facility] problems).  She is here w/ her dgt.   Patient has No headache, No chest pain, No abdominal pain - No Nausea, No new weakness tingling or numbness, No Cough - SOB.  Problem  Fibromyalgia    ALLERGIES: No Known Allergies  PAST MEDICAL HISTORY: Past Medical History:  Diagnosis Date  . Anxiety   . Back pain   . Depression   . Fibromyalgia   . Scoliosis     MEDICATIONS AT HOME: Prior to Admission medications   Medication Sig Start Date End Date Taking? Authorizing Provider  amoxicillin-clavulanate (AUGMENTIN) 875-125 MG tablet Take 1 tablet by mouth 2 (two) times daily. 11/02/16   Pete Glatter, MD  Cholecalciferol (VITAMIN D PO) Take 5,000 Units by mouth  daily.    [provider]  cyclobenzaprine (FLEXERIL) 10 MG tablet take 1 tablet by mouth three times a day if needed for muscle spasm 06/01/16   Lois Slagel T, MD  fluticasone (FLONASE) 50 MCG/ACT nasal spray Place 2 sprays into both nostrils daily. 11/02/16   Pete Glatter, MD  loratadine (CLARITIN) 10 MG tablet Take 1 tablet (10 mg total) by mouth daily. 11/02/16   Pete Glatter, MD  nortriptyline (PAMELOR) 10 MG capsule Take one capsule at night for one week, then take 2 capsules at night for one week, then take 3 capsules at night 07/29/16   York Spaniel, MD  triamcinolone ointment (KENALOG) 0.5 % Apply 1 application topically 2 (two) times daily. 11/02/16   Pete Glatter, MD  Zolpidem Tartrate (AMBIEN PO) Take 1 tablet by mouth as needed. Reported on 10/24/2015 Uses for sleep    [provider]     Objective:   Vitals:   11/02/16 1514  BP: 102/69  Pulse: 83  Resp: 16  Temp: 98.4 F (36.9 C)  TempSrc: Oral  SpO2: 98%  Weight: 163 lb 12.8 oz (74.3 kg)    Exam General appearance : Awake, alert, not in any distress. Speech Clear. Not toxic looking HEENT: Atraumatic and Normocephalic, pupils equally reactive to light.  bilat TMs  clear, nares slightly boggy bilat, mild ttp bilat frontal sinuses, not ttp max sinuses. oropharx clear, no enlarged tonsils/exudate. Neck: supple, no JVD. Pt is exquisately sensative to touch around her neck, left > right, I could barely touch her neck b/4 she flinches, but I there was no palpable cerv lad on my exam. Chest:Good air entry bilaterally, no added sounds. CVS: S1 S2 regular, no murmurs/gallups or rubs. Abdomen: Bowel sounds active, Non tender and not distended with no gaurding, rigidity or rebound. Extremities: B/L Lower Ext shows no edema, both legs are warm to touch Neurology: Awake alert, and oriented X 3, CN II-XII grossly intact, Non focal Skin: area dark pigmentation about 3 x 2 inch diameter bilat  axillary. Small pustule about pinpoint size anterior left shoulder not related to area of dark pigmentation  Data Review Lab Results  Component Value Date   HGBA1C 5.1 07/07/2016   HGBA1C 5.2 11/01/2013    Depression screen [facility] 2/9 11/02/2016 07/07/2016 05/21/2016 05/12/2016 11/01/2013  Decreased Interest 0 1 3 3  -  Down, Depressed, Hopeless 0 3 2 3  0  PHQ - 2 Score 0 4 5 6  0  Altered sleeping - 3 2 3  -  Tired, decreased energy - 3 2 3  -  Change in appetite - 3 2 3  -  Feeling bad or failure about yourself  - 3 3 3  -  Trouble concentrating - 3 3 3  -  Moving slowly or fidgety/restless - 0 (No Data) 3 -  Suicidal thoughts - 0 0 3 -  PHQ-9 Score - 19 17 27  -      Assessment & Plan   1. Seasonal allergic rhinitis, unspecified trigger, suspected - recd trial claritin w/ flonase nasal spray for now, if claritin does not wk in 1 wk, trial zyrtec otc. - I gave her rx for augmentin, but told her to hold off on taking b/c I believe this is more allergy related than actual infection. Of note, if this is brewing sinus infection, most sinus infections are viral, rather than bacterial. If she does fill the abx in few days, cautioned to take it w/ probiotics to prevent diarrhea.  2. Chronic bilateral low back pain with bilateral sciatica, suspect multifactorial, w/ fibromyalgia component - Ambulatory referral to Physical Therapy  - renewed since was helping - amb ref to PMR for further eval - I have a hard time changing her disability ppwk at this time until more definitive evaluation and treatment options tried.  3. Fibromyalgia Encouraged use of nortriptyline for now, pt did not want to try switching - encouraged her to make fu w/ Neurology - encouraged more activity, muscle stretches, daily exercising, yoga which all helps to relieve stress. - Ambulatory referral to Physical Therapy  4. Atopic dermatitis, bilat axillary - from detergent vs shirt she was unable to was prior. - pt states  she is vegan and uses all "vagan products" - trial kenalog cream   Patient have been counseled extensively about nutrition and exercise  Return in about 3 months (around 02/02/2017), or if symptoms worsen or fail to improve.  The patient was given clear instructions to go to ER or return to medical center if symptoms don't improve, worsen or new problems develop. The patient verbalized understanding. The patient was told to call to get lab results if they haven't heard anything in the next week.   This note has been created with Education officer, environmentalDragon speech recognition software and smart phrase technology. Any transcriptional errors are unintentional.  Pete Glatter, MD, MBA/MHA [facility] and [facility] [place], [place] [phone]   11/02/2016, 4:05 PM

## 2016-11-05 ENCOUNTER — Encounter: Payer: Self-pay | Admitting: Internal Medicine

## 2016-11-06 ENCOUNTER — Encounter: Payer: Self-pay | Admitting: Internal Medicine

## 2016-11-09 ENCOUNTER — Ambulatory Visit: Payer: Medicaid Other | Attending: Internal Medicine

## 2016-11-09 ENCOUNTER — Encounter: Payer: Self-pay | Admitting: Internal Medicine

## 2016-11-09 DIAGNOSIS — G8929 Other chronic pain: Secondary | ICD-10-CM | POA: Insufficient documentation

## 2016-11-09 DIAGNOSIS — R293 Abnormal posture: Secondary | ICD-10-CM | POA: Diagnosis present

## 2016-11-09 DIAGNOSIS — M62830 Muscle spasm of back: Secondary | ICD-10-CM

## 2016-11-09 DIAGNOSIS — M5442 Lumbago with sciatica, left side: Secondary | ICD-10-CM | POA: Insufficient documentation

## 2016-11-09 DIAGNOSIS — M2560 Stiffness of unspecified joint, not elsewhere classified: Secondary | ICD-10-CM | POA: Diagnosis present

## 2016-11-09 DIAGNOSIS — M6281 Muscle weakness (generalized): Secondary | ICD-10-CM | POA: Diagnosis present

## 2016-11-09 DIAGNOSIS — M25652 Stiffness of left hip, not elsewhere classified: Secondary | ICD-10-CM

## 2016-11-09 DIAGNOSIS — M25651 Stiffness of right hip, not elsewhere classified: Secondary | ICD-10-CM | POA: Diagnosis present

## 2016-11-09 NOTE — Therapy (Signed)
[facility] [address] [place], [place], [address] Phone: [phone]   Fax:  [phone]  Physical Therapy Evaluation  Patient Details  Name: Katelyn Wade MRN: [id number] Date of Birth: 02-May-1976 Referring Provider: Dierdre Searles, MD  Encounter Date: 11/09/2016      PT End of Session - 11/09/16 1220    Visit Number 1   Number of Visits 8   Date for PT Re-Evaluation 12/04/16   Authorization Type Medicaid ( Katelyn Wade opted not to have Medicaid billed after eval)   PT Start Time 1230   PT Stop Time 1320   PT Time Calculation (min) 50 min   Activity Tolerance Patient tolerated treatment well   Behavior During Therapy [facility] for tasks assessed/performed      Past Medical History:  Diagnosis Date  . Anxiety   . Back pain   . Depression   . Fibromyalgia   . Scoliosis     Past Surgical History:  Procedure Laterality Date  . ABDOMINAL HYSTERECTOMY    . ECTOPIC PREGNANCY SURGERY      There were no vitals filed for this visit.       Subjective Assessment - 11/09/16 1230    Subjective Katelyn Wade reports LBP , knee pain and foot pain.   Last seen in Feb 2018.  Katelyn Wade did not improve.  Katelyn Wade reports personal life  has stabilized.   Katelyn Wade reports using TENS at home. Moving was helpful.  Tennis ball at home is helpful.  Katelyn Wade reports doing issued HEP.      Pertinent History Katelyn Wade had back pain prior to the injury. This chronic pain has been for several years.   Rt neck , mid back, LT foot sprain   How long can you sit comfortably? 3-4 min  (Katelyn Wade was able to sit for 10 min intake without indication of discomfort.)   How long can you stand comfortably? 3-4 min   How long can you walk comfortably? 20 feet.  ( Katelyn Wade was able to walk the 50 feet from waiting room without change of normal gait pattern)   Patient Stated Goals Katelyn Wade wants to manage pain .  Katelyn Wade would like to exercise but is not able.      Currently in Pain? Yes   Pain Score 8    Pain Location  Back   Pain Orientation Lower;Posterior   Pain Descriptors / Indicators Aching;Shooting   Pain Type Chronic pain   Pain Onset More than a month ago   Pain Frequency Constant   Aggravating Factors  cold, stand/sit/stress   Pain Relieving Factors tennis ball ,    Multiple Pain Sites --  see above             [facility] PT Assessment - 11/09/16 0001      Assessment   Medical Diagnosis chronic lower back pain   Referring Provider Dierdre Searles, MD   Next MD Visit As needed   Prior Therapy Last seen in PT 07/2016     Precautions   Precautions None     Restrictions   Weight Bearing Restrictions No     Balance Screen   Has the patient fallen in the past 6 months No     Prior Function   Level of Independence Needs assistance with homemaking;Needs assistance with ADLs   Vocation Unemployed     Cognition   Overall Cognitive Status Within Functional Limits for tasks assessed     Posture/Postural Control   Posture Comments scoliosis,  increased lumbar lordosis, knees hyper extended     AROM   Lumbar Flexion 40   Lumbar Extension 16   Lumbar - Right Side Bend 16   Lumbar - Left Side Bend 10   Lumbar - Right Rotation 15   Lumbar - Left Rotation 15     PROM   Overall PROM Comments All hip motions liimited  active and passive due to pain     Strength   Overall Strength Comments Cogwheeled or gave with slightest pressure on testing   Right Hip Flexion 3-/5   Right Hip Extension 3-/5   Right Hip External Rotation  3/5   Right Hip Internal Rotation 3/5   Right Hip ABduction 3-/5   Right Hip ADduction 3/5   Left Hip Flexion 3-/5   Left Hip Extension 3-/5   Left Hip External Rotation 3/5   Left Hip Internal Rotation 3/5   Left Hip ABduction 3-/5   Left Hip ADduction 3/5     Flexibility   Hamstrings 15 degtrees due to report of pain     Ambulation/Gait   Gait Comments WNL                           PT Education - 11/09/16 1315    Education provided  Yes   Education Details POC.  Need to be consistent with attendance.  Need to show progress to continue PT    Person(s) Educated Patient   Methods Explanation   Comprehension Verbalized understanding             PT Long Term Goals - 11/09/16 1226      PT LONG TERM GOAL #1   Title Katelyn Wade will be independent with HEP issued   Time 4   Period Weeks   Status New     PT LONG TERM GOAL #2   Title Katelyn Wade will report back pain eased 25% or more .    Time 4   Period Weeks   Status New     PT LONG TERM GOAL #3   Title Katelyn Wade will improve active knee hip flexion to 100 degrees and  assisted SLR to 40 degrees    Time 4   Period Weeks   Status New     PT LONG TERM GOAL #4   Title FOTO score to be improved to 70 % limited or better   Time 4   Period Weeks   Status New               Plan - 11/09/16 1221    Clinical Impression Statement Katelyn Wade returns with the same reports of lower back pain . Katelyn Wade is a low complexity eval . He pain levels did not improve after last episode of PT care. Katelyn Wade did not tolerate TENS or dry needling during last session but reports using TENS at home nad needling hit sesitive spot so asked to stop. . Katelyn Wade did not do the HEP issued from the PT's .  Katelyn Wade also did not   Katelyn Wade also had inconsistent attendance completeing 7 visits over 2 months time. Will start PT again but will go no further than a month time and will dischare < 8 visits if attendance is poor.    Rehab Potential Fair   PT Frequency 2x / week   PT Duration 4 weeks   PT Treatment/Interventions Moist Heat;Passive range of motion;Manual techniques;Patient/family education;Therapeutic exercise   PT Next Visit Plan  HEP , Core strength , MHP, manual   PT Home Exercise Plan Stetching LTR, Knee to Chest, PPT, bridging , prone lying with glute squeeze and hamstring curl    Consulted and Agree with Plan of Care Patient      Patient will benefit from skilled therapeutic intervention in order to improve the  following deficits and impairments:  Decreased range of motion, Difficulty walking, Pain, Decreased activity tolerance, Increased muscle spasms, Postural dysfunction, Decreased strength  Visit Diagnosis: Chronic bilateral low back pain with left-sided sciatica  Joint stiffness of spine  Abnormal posture  Spasm of muscle, back  Stiffness of left hip, not elsewhere classified  Stiffness of right hip, not elsewhere classified  Muscle weakness (generalized)     Problem List Patient Active Problem List   Diagnosis Date Noted  . Fibromyalgia 11/02/2016  . Chronic bilateral low back pain with bilateral sciatica 07/01/2016  . Acute upper respiratory infection 05/28/2016    Caprice RedChasse, Odessia Asleson M PT 11/09/2016, 1:18 PM  [facility] [address] [place], [place], [address] Phone: [phone]   Fax:  [phone]  Name: Katelyn Wade MRN: [id number] Date of Birth: 06/22/1975

## 2016-11-11 ENCOUNTER — Ambulatory Visit: Payer: Medicaid Other

## 2016-11-18 ENCOUNTER — Ambulatory Visit: Payer: Medicaid Other | Admitting: Physical Therapy

## 2016-11-18 DIAGNOSIS — R293 Abnormal posture: Secondary | ICD-10-CM

## 2016-11-18 DIAGNOSIS — M2560 Stiffness of unspecified joint, not elsewhere classified: Secondary | ICD-10-CM

## 2016-11-18 DIAGNOSIS — M5442 Lumbago with sciatica, left side: Principal | ICD-10-CM

## 2016-11-18 DIAGNOSIS — G8929 Other chronic pain: Secondary | ICD-10-CM

## 2016-11-18 NOTE — Therapy (Signed)
[facility] [address] [place], [place], [address] Phone: [phone]   Fax:  [phone]  Physical Therapy Treatment  Patient Details  Name: Katelyn Wade MRN: [id number] Date of Birth: 1976-04-19 Referring Provider: Dierdre Searles, MD  Encounter Date: 11/18/2016      PT End of Session - 11/18/16 1500    Visit Number 2   Number of Visits 8   Date for PT Re-Evaluation 12/04/16   Authorization Type Medicaid ( She opted not to have Medicaid billed after eval)   PT Start Time 1445   PT Stop Time 1525   PT Time Calculation (min) 40 min   Activity Tolerance Patient limited by pain   Behavior During Therapy [facility] for tasks assessed/performed      Past Medical History:  Diagnosis Date  . Anxiety   . Back pain   . Depression   . Fibromyalgia   . Scoliosis     Past Surgical History:  Procedure Laterality Date  . ABDOMINAL HYSTERECTOMY    . ECTOPIC PREGNANCY SURGERY      There were no vitals filed for this visit.      Subjective Assessment - 11/18/16 1447    Subjective Pt having a lot of pain, feeling bad today,  Late,  as she said her appt time changed and she had to pick up her daughter. I really want to walk better so I can stop my back from hurting   Currently in Pain? Yes   Pain Score 7    Pain Location Back   Pain Orientation Mid;Lower;Right;Left   Pain Descriptors / Indicators Aching;Burning   Pain Type Chronic pain   Pain Onset More than a month ago   Pain Frequency Constant   Aggravating Factors  cold, standing, activity   Pain Relieving Factors tennis ball, extension of spine    Effect of Pain on Daily Activities wants to be active for her daughter.              [facility] PT Assessment - 11/18/16 1948      Ambulation/Gait   Ambulation/Gait Yes   Ambulation/Gait Assistance 6: Modified independent (Device/Increase time)   Ambulation Distance (Feet) 300 Feet   Assistive device None   Gait Pattern  Decreased stance time - right;Antalgic;Lateral hip instability;Lateral trunk lean to right;Lateral trunk lean to left   Ambulation Surface Level;Indoor   Gait velocity slower than normal   Gait Comments limps on Rt. to decreased WB, states she favored her L for a long time due to wearing the boot and then put more stress on her Rt. LE, back pain increased             [facility] Adult PT Treatment/Exercise - 11/18/16 1948      Self-Care   Self-Care Posture;Heat/Ice Application;Other Self-Care Comments   Posture lordosis and compression   Heat/Ice Application ice to relieve burning   Other Self-Care Comments  HEP refresher: prone      Lumbar Exercises: Stretches   Active Hamstring Stretch 2 reps;30 seconds   Active Hamstring Stretch Limitations done in sitting and supine, more limited with pain on Rt. LE    Lower Trunk Rotation 10 seconds   Lower Trunk Rotation Limitations x 10   Pelvic Tilt 5 reps   Pelvic Tilt Limitations less pain with anterior tilt   Prone on Elbows Stretch 1 rep;30 seconds   Prone on Elbows Stretch Limitations less pain in LE and back      Lumbar  Exercises: Prone   Other Prone Lumbar Exercises prone pelvic press : isometric Tr A x 10, added knee flexion and then hip ext x 5-8 each LE.      Ankle Exercises: Stretches   Plantar Fascia Stretch 2 reps;30 seconds   Soleus Stretch 1 rep;30 seconds   Slant Board Stretch 3 reps;30 seconds                PT Education - 11/18/16 1955    Education provided Yes   Education Details prone pelvic press, self care    Person(s) Educated Patient   Methods Explanation   Comprehension Verbalized understanding;Verbal cues required;Tactile cues required;Need further instruction             PT Long Term Goals - 11/18/16 2000      PT LONG TERM GOAL #1   Title she will be independent with HEP issued   Status On-going     PT LONG TERM GOAL #2   Title She will report back pain eased 25% or more .    Status  On-going     PT LONG TERM GOAL #3   Title She will improve active knee hip flexion to 100 degrees and  assisted SLR to 40 degrees    Status On-going     PT LONG TERM GOAL #4   Title FOTO score to be improved to 70 % limited or better   Status On-going               Plan - 11/18/16 1449    Clinical Impression Statement Patient was able to do gentle exercises today but with low tolerance for pain.  She obviously has issues with gait, foot but this is not likely related to her low back pain. Supine exercises do not help her pain.  Prone seemed better for her but tends to hyperextend her lumbar spine.  Work in standing will prove more beneficial to correct postural abnormalties and engage core muscles properly.    PT Next Visit Plan HEP , standing Core strength , MHP, manual   PT Home Exercise Plan Stetching LTR, Knee to Chest, PPT, bridging , prone lying with glute squeeze and hamstring curl (hold supine)    Consulted and Agree with Plan of Care Patient      Patient will benefit from skilled therapeutic intervention in order to improve the following deficits and impairments:  Decreased range of motion, Difficulty walking, Pain, Decreased activity tolerance, Increased muscle spasms, Postural dysfunction, Decreased strength  Visit Diagnosis: Chronic bilateral low back pain with left-sided sciatica  Joint stiffness of spine  Abnormal posture     Problem List Patient Active Problem List   Diagnosis Date Noted  . Fibromyalgia 11/02/2016  . Chronic bilateral low back pain with bilateral sciatica 07/01/2016  . Acute upper respiratory infection 05/28/2016    Katelyn Wade 11/18/2016, 8:01 PM  [facility] [address] [place], [place], [address] Phone: [phone]   Fax:  [phone]  Name: Katelyn Wade MRN: [id number] Date of Birth: 10/05/1975  Katelyn Wade, PT 11/18/16 8:01 PM Phone: [phone] Fax: [phone]

## 2016-12-01 ENCOUNTER — Ambulatory Visit: Payer: Medicaid Other | Admitting: Physical Therapy

## 2016-12-14 ENCOUNTER — Encounter: Payer: Self-pay | Admitting: Physical Therapy

## 2016-12-14 ENCOUNTER — Ambulatory Visit: Payer: Medicaid Other | Attending: Internal Medicine | Admitting: Physical Therapy

## 2016-12-14 DIAGNOSIS — G8929 Other chronic pain: Secondary | ICD-10-CM | POA: Diagnosis present

## 2016-12-14 DIAGNOSIS — R293 Abnormal posture: Secondary | ICD-10-CM

## 2016-12-14 DIAGNOSIS — M5442 Lumbago with sciatica, left side: Secondary | ICD-10-CM | POA: Diagnosis present

## 2016-12-14 NOTE — Patient Instructions (Addendum)
Pelvic Tilt: Posterior - Legs Bent (Supine)    Tighten stomach and flatten back by rolling pelvis down. Hold _5___ seconds. Relax. Repeat ___10_ times per set. Do __1-2__ sets per session. Do ___2_ sessions per day.  http://orth.exer.us/203   Copyright  VHI. All rights reserved.    PELVIC STABILIZATION: Basic Bridge    Exhaling, lift hips. Hold for ___ breaths. Exhaling, release hips back to floor. Repeat _10__ times. Do __2_ times per day.  Copyright  VHI. All rights reserved.    Lower Trunk Rotation Stretch    Keeping back flat and feet together, rotate knees to left side. Hold ___10_ seconds. Repeat __10__ times per set. Do _1___ sets per session. Do __2__ sessions per day.  http://orth.exer.us/123   Copyright  VHI. All rights reserved.    Knee to Chest (Flexion)    Pull knee toward chest. Feel stretch in lower back or buttock area. Breathing deeply, Hold ___30_ seconds. Repeat with other knee. Repeat _3__ times. Do ____2 sessions per day.  CAN ALSO DO DOUBLE KNEE TO CHEST   http://gt2.exer.us/226   Copyright  VHI. All rights reserved.     Reducing Load   Copyright  VHI. All rights reserved.  BODY MECHANICS Tips Good body mechanics are important during activities of daily living. The practice of good body mechanics will: -help distribute weight throughout the skeleton in a more anatomically correct manner thus stimulating more normal forces on the bones, and encouraging stronger, healthier, denser bones. -reduce unnatural forces on bones, ligaments, joints and muscles and reduce risk of fracture, other injury or back pain. A WORD ON BODY POSITIONING: Sitting is the hardest position for the back. Lying on the back is the easiest. Standing, in good body alignment, is somewhere between. A good motto is: Sit less, stand more, and, when you can't do that, lie down on your back and exercise to strengthen it.  Copyright  VHI. All rights reserved.        Supine to Sit (Active)   Lie on back, left leg bent. Roll to other side. From side-lying, sit up on side of bed. Complete ___ sets of ___ repetitions. Perform ___ sessions per day.  Copyright  VHI. All rights reserved.    Housework - Reaching Down   If you are unable to bend your knees or squat, use a lazy Darl Pikes to keep items within easy reach. Store only light, unbreakable items on the lowest shelves, and use a reacher to pick them up.  Copyright  VHI. All rights reserved.  Low Shelf   Squat down, and bring item close to lift.   Copyright  VHI. All rights reserved.  Lifting Principles .Maintain proper posture and head alignment. .Slide object as close as possible before lifting. .Move obstacles out of the way. .Test before lifting; ask for help if too heavy. .Tighten stomach muscles without holding breath. .Use smooth movements; do not jerk. .Use legs to do the work, and pivot with feet. .Distribute the work load symmetrically and close to the center of trunk. .Push instead of pull whenever possible.  Copyright  VHI. All rights reserved.  Posture - Standing   Good posture is important. Avoid slouching and forward head thrust. Maintain curve in low back and align ears over shoul- ders, hips over ankles.   Copyright  VHI. All rights reserved.   Posture - Sitting   Sit upright, head facing forward. Try using a roll to support lower back. Keep shoulders relaxed, and avoid rounded back. Keep  hips level with knees. Avoid crossing legs for long periods.   Copyright  VHI. All rights reserved.  Ideal Posture Use with figures on 3 (2 of 2): 1.Head erect 2.Chin in 3.Chest and navel aligned 4.Spinal curves maintained 5.Knees relaxed 6.Shoulders and hips aligned 7.Feet slightly apart 8.Toes and arches active 9.Abdomen taut (breathe with diaphragm) 10.Arms at sides Ideal posture is: -pain free. -achieved with practice, mindful interest, and body  awareness.  Copyright  VHI. All rights reserved.     Move heavy items one at a time, or move portions of the contents.   Posture Awareness     Stand and check posture: Jut chin, pull back to comfortable position. Tilt pelvis forward, back; be sure back is not swayed. Roll from heels to balls of feet, then distribute your weight evenly. Picture a line through spine pulling you erect. Focus on breathing. Good Posture = Better Breathing. Check ____ times per day.  http://gt2.exer.us/873   Copyright  VHI. All rights reserved.

## 2016-12-14 NOTE — Therapy (Signed)
[facility] [address] [place], [place], [address] Phone: [phone]   Fax:  [phone]  Physical Therapy Treatment  Patient Details  Name: Katelyn Wade MRN: [id number] Date of Birth: [birth year] Referring Provider: Dierdre Searles, MD  Encounter Date: 12/14/2016      PT End of Session - 12/14/16 1458    Visit Number 3   Date for PT Re-Evaluation 01/25/17   PT Start Time 1428   PT Stop Time 1506   PT Time Calculation (min) 38 min   Activity Tolerance Patient tolerated treatment well   Behavior During Therapy [facility] for tasks assessed/performed      Past Medical History:  Diagnosis Date  . Anxiety   . Back pain   . Depression   . Fibromyalgia   . Scoliosis     Past Surgical History:  Procedure Laterality Date  . ABDOMINAL HYSTERECTOMY    . ECTOPIC PREGNANCY SURGERY      There were no vitals filed for this visit.      Subjective Assessment - 12/14/16 1430    Subjective I have alot of stress, my daughter is settled and I am now better able to focus on herself.  Has done the exercises.     Currently in Pain? Yes   Pain Score 8    Pain Location Back   Pain Orientation Mid;Lower   Pain Descriptors / Indicators Aching;Burning   Pain Type Chronic pain   Pain Radiating Towards LLT LE    Pain Onset More than a month ago   Pain Frequency Constant   Aggravating Factors  cold, standing, activity   Pain Relieving Factors heat, rest             [facility] PT Assessment - 12/14/16 0001      Strength   Right Hip Flexion 3-/5   Right Hip Extension 3-/5   Right Hip External Rotation  3/5   Right Hip Internal Rotation 3/5   Right Hip ABduction 3-/5   Right Hip ADduction 3/5   Left Hip Flexion 3-/5   Left Hip Extension 3-/5   Left Hip External Rotation 3/5   Left Hip Internal Rotation 3/5   Left Hip ABduction 3-/5   Left Hip ADduction 3/5             [facility] Adult PT Treatment/Exercise - 12/14/16 0001      Lumbar Exercises: Stretches   Single Knee to Chest Stretch 2 reps;30 seconds   Double Knee to Chest Stretch 2 reps;30 seconds   Lower Trunk Rotation 10 seconds   Lower Trunk Rotation Limitations x 10     Lumbar Exercises: Supine   Ab Set 10 reps   Clam 10 reps   Bridge 10 reps     Lumbar Exercises: Prone   Other Prone Lumbar Exercises prone pelvic press : isometric Tr A x 10, added knee flexion and then hip ext x 5-8 each LE.                 PT Education - 12/14/16 1458    Education provided Yes   Education Details HEP, attendance , goals   Person(s) Educated Patient   Methods Explanation;Demonstration;Tactile cues;Verbal cues;Handout   Comprehension Verbalized understanding;Need further instruction             PT Long Term Goals - 12/14/16 1436      PT LONG TERM GOAL #1   Title she will be independent with HEP issued   Status  On-going     PT LONG TERM GOAL #2   Title She will report back pain eased 25% or more .    Baseline 5%   Status On-going     PT LONG TERM GOAL #3   Title She will improve active knee hip flexion to 100 degrees and  assisted SLR to 40 degrees    Status On-going     PT LONG TERM GOAL #4   Title FOTO score to be improved to 70 % limited or better   Status On-going     PT LONG TERM GOAL #5   Title Pt will demo proper lifting of light to mod sized items from the floor to waist height without increasing pain    Time 4   Period Weeks   Status New               Plan - 12/14/16 1500    Clinical Impression Statement Pt returns with severe pain, continuing in low back radiating to LLE.  She needs to move with better postural awareness.  She continues to be weak in LEs and core.  She will be seen for 6 more weeks, hopes to be able to attend more regularly.     PT Next Visit Plan HEP , standing Core strength , MHP, manual   PT Home Exercise Plan Stetching LTR, Knee to Chest, PPT, bridging , prone lying with glute squeeze and  hamstring curl (hold supine)    Consulted and Agree with Plan of Care Patient      Patient will benefit from skilled therapeutic intervention in order to improve the following deficits and impairments:  Decreased range of motion, Difficulty walking, Pain, Decreased activity tolerance, Increased muscle spasms, Postural dysfunction, Decreased strength  Visit Diagnosis: Chronic bilateral low back pain with left-sided sciatica  Abnormal posture     Problem List Patient Active Problem List   Diagnosis Date Noted  . Fibromyalgia 11/02/2016  . Chronic bilateral low back pain with bilateral sciatica 07/01/2016  . Acute upper respiratory infection 05/28/2016    PAA,JENNIFER 12/14/2016, 5:53 PM  [facility] [address] [place], [place], [address] Phone: [phone]   Fax:  [phone]  Name: Katelyn Wade MRN: [id number] Date of Birth: 09/29/1975  Karie MainlandJennifer Paa, PT 12/14/16 5:54 PM Phone: [phone] Fax: [phone]

## 2016-12-15 ENCOUNTER — Ambulatory Visit (INDEPENDENT_AMBULATORY_CARE_PROVIDER_SITE_OTHER): Payer: Medicaid Other | Admitting: Neurology

## 2016-12-15 ENCOUNTER — Encounter: Payer: Self-pay | Admitting: Neurology

## 2016-12-15 VITALS — BP 110/73 | HR 52 | Ht 62.75 in | Wt 173.5 lb

## 2016-12-15 DIAGNOSIS — M797 Fibromyalgia: Secondary | ICD-10-CM

## 2016-12-15 MED ORDER — MILNACIPRAN HCL 12.5 MG PO TABS: 12.5000 mg | ORAL_TABLET | Freq: Every day | ORAL | 1 refills | Status: AC

## 2016-12-15 NOTE — Progress Notes (Signed)
Reason for visit: Fibromyalgia  Katelyn Wade is an 41 y.o. female  History of present illness:  Katelyn Wade is a 41 year old right-handed black female with a history of diffuse neuromuscular discomfort consistent with fibromyalgia. Blood work has been done that shows a very low vitamin D level, she is currently not on vitamin D supplementation. Katelyn Wade was placed on nortriptyline but she could not tolerate Katelyn medication and she stopped it after 2 weeks. Katelyn Wade in Katelyn past claims that she has not tolerated Cymbalta or Lyrica, it is not clear whether or not she has been on gabapentin. Katelyn Wade is undergoing physical therapy with some benefit. She continues to have pain throughout Katelyn body including Katelyn back and down Katelyn legs, Katelyn Wade reports some joint discomfort as well and some popping in Katelyn neck when she turns her head. Katelyn Wade is considering going on chondroitin sulfate for Katelyn joints.  Past Medical History:  Diagnosis Date  . Anxiety   . Back pain   . Depression   . Fibromyalgia   . Scoliosis     Past Surgical History:  Procedure Laterality Date  . ABDOMINAL HYSTERECTOMY    . ECTOPIC PREGNANCY SURGERY      Family History  Problem Relation Age of Onset  . Bipolar disorder Mother   . Fibromyalgia Mother     Social history:  reports that she has never smoked. She has never used smokeless tobacco. She reports that she does not drink alcohol or use drugs.   No Known Allergies  Medications:  Prior to Admission medications   Medication Sig Start Date End Date Taking? Authorizing Provider  fluticasone (FLONASE) 50 MCG/ACT nasal spray Place 2 sprays into both nostrils daily. 11/02/16  Yes Langeland, Dawn T, MD  loratadine (CLARITIN) 10 MG tablet Take 1 tablet (10 mg total) by mouth daily. 11/02/16  Yes Pete Glatter, MD  nortriptyline (PAMELOR) 10 MG capsule Take one capsule at night for one week, then take 2 capsules at night for one week, then take 3  capsules at night 07/29/16  Yes York Spaniel, MD    ROS:  Out of a complete 14 system review of symptoms, Katelyn Wade complains only of Katelyn following symptoms, and all other reviewed systems are negative.  Decreased appetite, excessive sweating Light sensitivity Chest pain, leg swelling  Nausea Restless legs, insomnia, frequent waking Joint pain, joint swelling, back pain, achy muscles, muscle cramps, walking difficulty, neck pain, neck stiffness Moles, itching Swollen lymph nodes, bruising easily, anemia Headache, numbness, weakness Depression, anxiety  Blood pressure 110/73, pulse (!) 52, height 5' 2.75" (1.594 m), weight 173 lb 8 oz (78.7 kg).  Physical Exam  General: Katelyn Wade is alert and cooperative at Katelyn time of Katelyn examination.  Neuromuscular: Wade lacks about 15 of full lateral rotation of Katelyn cervical spine bilaterally. With Katelyn low back, Katelyn Wade is able to flex to about 115.  Skin: No significant peripheral edema is noted.   Neurologic Exam  Mental status: Katelyn Wade is alert and oriented x 3 at Katelyn time of Katelyn examination. Katelyn Wade has apparent normal recent and remote memory, with an apparently normal attention span and concentration ability.   Cranial nerves: Facial symmetry is present. Speech is normal, no aphasia or dysarthria is noted. Extraocular movements are full. Visual fields are full.  Motor: Katelyn Wade has good strength in all 4 extremities.  Sensory examination: Soft touch sensation is symmetric on Katelyn face, arms, and  legs.  Coordination: Katelyn Wade has good finger-nose-finger and heel-to-shin bilaterally.  Gait and station: Katelyn Wade has a normal gait. Tandem gait is normal. Romberg is negative. No drift is seen.  Reflexes: Deep tendon reflexes are symmetric.   Assessment/Plan:  1. Fibromyalgia  Katelyn Wade needs to have ongoing chronic pain. She is to go on a daily supplementation of vitamin D taking 2000  international units daily. Katelyn Wade will be placed on Savella, if this is not effective, she will be placed on tizanidine. Katelyn Wade will follow-up in about 4 months.   Marlan Palau. Keith Ermagene Saidi MD 12/15/2016 12:31 PM  [facility] [address] [place], [place] [address]  Phone [phone] Fax [phone]

## 2016-12-16 ENCOUNTER — Telehealth: Payer: Self-pay | Admitting: *Deleted

## 2016-12-16 NOTE — Telephone Encounter (Signed)
Called [facility]. Spoke with Rosanne AshingJim. Initiated PA SheridanSavella. Gave clinical information. Patienet has tried/failed cymbalta, lyrica, nortriptyline. Pt unsure if she has tried gabapentin in the past. Requested PA be on file for 1 year if approved. Diagnosis: Fibromyalgia (ICD-10: M79.7). He is sending to clinical pharmacist for review. PI#[id number]#[id number]  Interaction ID# for call: [id number]  Advised us to call back to check on status of PA.

## 2016-12-22 ENCOUNTER — Ambulatory Visit: Payer: Medicaid Other | Attending: Internal Medicine | Admitting: Physical Therapy

## 2016-12-22 ENCOUNTER — Telehealth: Payer: Self-pay | Admitting: *Deleted

## 2016-12-22 ENCOUNTER — Encounter: Payer: Self-pay | Admitting: Physical Therapy

## 2016-12-22 DIAGNOSIS — G8929 Other chronic pain: Secondary | ICD-10-CM | POA: Insufficient documentation

## 2016-12-22 DIAGNOSIS — M25651 Stiffness of right hip, not elsewhere classified: Secondary | ICD-10-CM | POA: Insufficient documentation

## 2016-12-22 DIAGNOSIS — M5442 Lumbago with sciatica, left side: Secondary | ICD-10-CM | POA: Insufficient documentation

## 2016-12-22 DIAGNOSIS — M25652 Stiffness of left hip, not elsewhere classified: Secondary | ICD-10-CM | POA: Diagnosis present

## 2016-12-22 DIAGNOSIS — M6281 Muscle weakness (generalized): Secondary | ICD-10-CM | POA: Insufficient documentation

## 2016-12-22 DIAGNOSIS — M2560 Stiffness of unspecified joint, not elsewhere classified: Secondary | ICD-10-CM | POA: Diagnosis present

## 2016-12-22 DIAGNOSIS — R293 Abnormal posture: Secondary | ICD-10-CM

## 2016-12-22 DIAGNOSIS — M62830 Muscle spasm of back: Secondary | ICD-10-CM | POA: Diagnosis present

## 2016-12-22 NOTE — Telephone Encounter (Addendum)
Re-initiated new PA request w/ [place] [employer] [phone]).  Approved - [id number].  Valid through 12/17/17.  Pharmacy notified and will fill for the patient.

## 2016-12-22 NOTE — Telephone Encounter (Signed)
[place] Medicaid has denied Savella stating patient has to have a documented failure to gabapentin first.      Katelyn Wade, Katelyn L, RN  12/16/16 2:13 PM  Note    Called [facility]. Spoke with Rosanne AshingJim. Initiated PA MorichesSavella. Gave clinical information. Katelyn Wade has tried/failed cymbalta, lyrica, nortriptyline. Pt unsure if she has tried gabapentin in the past. Requested PA be on file for 1 year if approved. Diagnosis: Fibromyalgia (ICD-10: M79.7). He is sending to clinical pharmacist for review. GE#[id number]#[id number]  Interaction ID# for call: [id number]  Advised us to call back to check on status of PA.

## 2016-12-22 NOTE — Therapy (Signed)
[facility] [address] [place], [place], [address] Phone: [phone]   Fax:  [phone]  Physical Therapy Treatment  Patient Details  Name: Katelyn Wade MRN: [id number] Date of Birth: 05/07/1976 Referring Provider: Dierdre Searlesawn Langeland, MD  Encounter Date: 12/22/2016      PT End of Session - 12/22/16 0929    Visit Number 4   Number of Visits 8   Date for PT Re-Evaluation 01/25/17   Authorization Type Medicaid ( She opted not to have Medicaid billed after eval)   PT Start Time 0930   PT Stop Time 1008   PT Time Calculation (min) 38 min   Activity Tolerance Patient tolerated treatment well   Behavior [facility] Therapy [facility] tasks assessed/performed      Past Medical History:  Diagnosis Date  . Anxiety   . Back pain   . Depression   . Fibromyalgia   . Scoliosis     Past Surgical History:  Procedure Laterality Date  . ABDOMINAL HYSTERECTOMY    . ECTOPIC PREGNANCY SURGERY      There were no vitals filed for this visit.      Subjective Assessment - 12/22/16 0928    Subjective "I am doing the exercise at home, some movement is better than what it was"    Currently in Pain? Yes   Pain Score 7    Pain Location Back   Pain Orientation Lower;Mid   Pain Descriptors / Indicators Aching;Burning   Pain Type Chronic pain   Pain Onset More than a month ago   Pain Frequency Intermittent   Aggravating Factors  stepping with the L Leg,   Pain Relieving Factors tennis technique,                          [facility] Adult PT Treatment/Exercise - 12/22/16 0936      Lumbar Exercises: Stretches   Active Hamstring Stretch 3 reps;30 seconds  contract/ relax with 10 sec hold   Double Knee to Chest Stretch 2 reps;30 seconds  verbal cues to hold longer   Lower Trunk Rotation --  2 x 10   Prone Mid Back Stretch 2 reps;30 seconds  childs pose      Lumbar Exercises: Seated   Hip Flexion on Ball --  seated on dyna disc    Hip Flexion on Ball Limitations 1 x 10 ab set with 3 sec hold  posterior pelvic tilt 1 x 10     Lumbar Exercises: Supine   Straight Leg Raise --  2 x 8 reps   Straight Leg Raises Limitations pt required multiple cues to conitnue with exercise  pt wanted to stop multiple times at 2-4 reps   Other Supine Lumbar Exercises table top postion 3 x 10      Manual Therapy   Joint Mobilization grade 3 anterior innominate mob on the L                PT Education - 12/22/16 1007    Education provided Yes   Education Details anatomy of the low back and SIJ and effects of hamstring and hip flexors on the SIJ.    Person(s) Educated Patient   Methods Explanation;Verbal cues   Comprehension Verbalized understanding;Verbal cues required             PT Long Term Goals - 12/14/16 1436      PT LONG TERM GOAL #1   Title she will  be independent with HEP issued   Status On-going     PT LONG TERM GOAL #2   Title She will report back pain eased 25% or more .    Baseline 5%   Status On-going     PT LONG TERM GOAL #3   Title She will improve active knee hip flexion to 100 degrees and  assisted SLR to 40 degrees    Status On-going     PT LONG TERM GOAL #4   Title FOTO score to be improved to 70 % limited or better   Status On-going     PT LONG TERM GOAL #5   Title Pt will demo proper lifting of light to mod sized items from the floor to waist height without increasing pain    Time 4   Period Weeks   Status New               Plan - 12/22/16 1007    Clinical Impression Statement pt continues to report 7/10 pain in the L low back at the SIJ. continued core strengthening which pt requires mulitple cues to stay on task or finish set. educated about SIJ and effects of muscles and beneifts of stretching/ strengthening the correct muscles. During exercise pt required multiple cues as to benefit of doing more then 1-2 reps of exercise due pt requesting to rest or switching to the  RLE after 1-2 reps but was able to complete set with cueing. pt declined modalities stating she plans to do them at home.    PT Next Visit Plan HEP , standing Core strength , MHP, manual   PT Home Exercise Plan Stetching LTR, Knee to Chest, PPT, bridging , prone lying with glute squeeze and hamstring curl (hold supine)    Consulted and Agree with Plan of Care Patient      Patient will benefit from skilled therapeutic intervention in order to improve the following deficits and impairments:  Decreased range of motion, Difficulty walking, Pain, Decreased activity tolerance, Increased muscle spasms, Postural dysfunction, Decreased strength  Visit Diagnosis: Chronic bilateral low back pain with left-sided sciatica  Abnormal posture  Joint stiffness of spine  Spasm of muscle, back  Stiffness of left hip, not elsewhere classified  Stiffness of right hip, not elsewhere classified  Muscle weakness (generalized)     Problem List Patient Active Problem List   Diagnosis Date Noted  . Fibromyalgia 11/02/2016  . Chronic bilateral low back pain with bilateral sciatica 07/01/2016  . Acute upper respiratory infection 05/28/2016   Katelyn Wade PT, DPT, LAT, ATC  12/22/16  10:12 AM      [facility] [address] [place], [place], [address] Phone: [phone]   Fax:  [phone]  Name: Katelyn Wade MRN: [id number] Date of Birth: [birth year]

## 2016-12-22 NOTE — Telephone Encounter (Signed)
I called the patient. She indicates that she has tried gabapentin previously, she could not tolerate the drug as it resulted in nausea.  She has also tried Lyrica and similar problems have been noted with this.  [facility].

## 2016-12-25 ENCOUNTER — Encounter: Payer: Self-pay | Admitting: Physical Therapy

## 2016-12-25 ENCOUNTER — Ambulatory Visit: Payer: Medicaid Other | Admitting: Physical Therapy

## 2016-12-25 DIAGNOSIS — M6281 Muscle weakness (generalized): Secondary | ICD-10-CM

## 2016-12-25 DIAGNOSIS — M25652 Stiffness of left hip, not elsewhere classified: Secondary | ICD-10-CM

## 2016-12-25 DIAGNOSIS — M25651 Stiffness of right hip, not elsewhere classified: Secondary | ICD-10-CM

## 2016-12-25 DIAGNOSIS — M2560 Stiffness of unspecified joint, not elsewhere classified: Secondary | ICD-10-CM

## 2016-12-25 DIAGNOSIS — M5442 Lumbago with sciatica, left side: Principal | ICD-10-CM

## 2016-12-25 DIAGNOSIS — M62830 Muscle spasm of back: Secondary | ICD-10-CM

## 2016-12-25 DIAGNOSIS — G8929 Other chronic pain: Secondary | ICD-10-CM

## 2016-12-25 DIAGNOSIS — R293 Abnormal posture: Secondary | ICD-10-CM

## 2016-12-25 NOTE — Therapy (Addendum)
[place], [place], [address] Phone: [phone]   Fax:  [phone]  Physical Therapy Treatment / Discharge Summary  Patient Details  Name: Katelyn Wade MRN: [id number] Date of Birth: 09/16/1975 Referring Provider: Lottie Mussel, MD  Encounter Date: 12/25/2016      PT End of Session - 12/25/16 1113    Visit Number 5   Number of Visits 8   Date for PT Re-Evaluation 01/25/17   Authorization Type Medicaid ( She opted not to have Medicaid billed after eval)   PT Start Time 1113  pt was 13 minutes late today   PT Stop Time 1146   PT Time Calculation (min) 33 min   Activity Tolerance Patient tolerated treatment well   Behavior During Therapy [facility] for tasks assessed/performed      Past Medical History:  Diagnosis Date  . Anxiety   . Back pain   . Depression   . Fibromyalgia   . Scoliosis     Past Surgical History:  Procedure Laterality Date  . ABDOMINAL HYSTERECTOMY    . ECTOPIC PREGNANCY SURGERY      There were no vitals filed for this visit.      Subjective Assessment - 12/25/16 1113    Subjective "I didn't sleep well and my neck and back or more sore today"    Currently in Pain? Yes   Pain Score 8    Pain Location Back   Pain Orientation Lower   Pain Type Chronic pain   Pain Onset More than a month ago   Pain Frequency Intermittent                         [facility] Adult PT Treatment/Exercise - 12/25/16 1119      Lumbar Exercises: Stretches   Active Hamstring Stretch 2 reps;30 seconds   Lower Trunk Rotation --  2 x 10 with 5 sec hold ea. way     Lumbar Exercises: Aerobic   Stationary Bike Nu-step L4 x 8 min UE/LE     Lumbar Exercises: Supine   Bent Knee Raise 15 reps  with sustained posterior pelvic tilt   Bent Knee Raise Limitations tactile cues to maintain posterior pelvic tilt     Lumbar Exercises: Quadruped   [place] 10 reps  with 5 sec hold     Manual Therapy   Manual Therapy Muscle Energy Technique   Manual therapy comments manual trigger point release over L lumbar paraspinals    Joint Mobilization grade 3 anterior innominate mob on the L   Muscle Energy Technique prone resisted L hip flexion combnined in innominated anterior mob 10 x 10 sec hold                     PT Long Term Goals - 12/14/16 1436      PT LONG TERM GOAL #1   Title she will be independent with HEP issued   Status On-going     PT LONG TERM GOAL #2   Title She will report back pain eased 25% or more .    Baseline 5%   Status On-going     PT LONG TERM GOAL #3   Title She will improve active knee hip flexion to 100 degrees and  assisted SLR to 40 degrees    Status On-going     PT LONG TERM GOAL #4   Title FOTO score to be improved to 70 %  limited or better   Status On-going     PT LONG TERM GOAL #5   Title Pt will demo proper lifting of light to mod sized items from the floor to waist height without increasing pain    Time 4   Period Weeks   Status New               Plan - 12/25/16 1150    Clinical Impression Statement pt arrived 13 minutes late today. pt reported pain 7-8/10 inthe low back today. continued work for L posterior innominate rotation with prone L resisted hip flexion with anterior innominate mobilization. conitnued work work and which she continues to require verbal cues to complete the whole set. post session she reported pain dropped to 6/10 post session and declinded modalities.    PT Next Visit Plan HEP , standing Core strength , MHP, manual, poserior rotation of the L innominate.    PT Home Exercise Plan Stetching LTR, Knee to Chest, PPT, bridging , prone lying with glute squeeze and hamstring curl (hold supine)       Patient will benefit from skilled therapeutic intervention in order to improve the following deficits and impairments:  Decreased range of motion, Difficulty walking, Pain, Decreased activity  tolerance, Increased muscle spasms, Postural dysfunction, Decreased strength  Visit Diagnosis: Chronic bilateral low back pain with left-sided sciatica  Abnormal posture  Joint stiffness of spine  Spasm of muscle, back  Stiffness of left hip, not elsewhere classified  Stiffness of right hip, not elsewhere classified  Muscle weakness (generalized)     Problem List Patient Active Problem List   Diagnosis Date Noted  . Fibromyalgia 11/02/2016  . Chronic bilateral low back pain with bilateral sciatica 07/01/2016  . Acute upper respiratory infection 05/28/2016   Starr Lake PT, DPT, LAT, ATC  12/25/16  11:55 AM      [facility] [address] [place], [place], [address] Phone: [phone]   Fax:  [phone]  Name: Katelyn Wade MRN: [id number] Date of Birth: May 02, 1976     PHYSICAL THERAPY DISCHARGE SUMMARY  Visits from Start of Care: 5  Current functional level related to goals / functional outcomes: See goals    Remaining deficits: unknown   Education / Equipment: HEP  Plan: Patient agrees to discharge.  Patient goals were not met. Patient is being discharged due to not returning since the last visit.  ?????    Alexes Menchaca PT, DPT, LAT, ATC  02/04/17  10:53 AM

## 2016-12-30 ENCOUNTER — Ambulatory Visit: Payer: Medicaid Other | Admitting: Physical Therapy

## 2017-01-04 ENCOUNTER — Ambulatory Visit: Payer: Medicaid Other | Admitting: Physical Therapy

## 2017-01-06 ENCOUNTER — Encounter: Payer: Medicaid Other | Admitting: Physical Therapy

## 2017-02-11 ENCOUNTER — Ambulatory Visit: Payer: Medicaid Other | Attending: Internal Medicine | Admitting: Physician Assistant

## 2017-02-11 ENCOUNTER — Encounter: Payer: Self-pay | Admitting: Physician Assistant

## 2017-02-11 ENCOUNTER — Other Ambulatory Visit (HOSPITAL_COMMUNITY)
Admission: RE | Admit: 2017-02-11 | Discharge: 2017-02-11 | Disposition: A | Discharge status: Home / Self Care | Payer: Medicaid Other | Source: Ambulatory Visit | Attending: Internal Medicine | Admitting: Internal Medicine

## 2017-02-11 VITALS — BP 88/60 | HR 80 | Temp 98.2°F | Resp 18 | Ht 62.0 in | Wt 176.0 lb

## 2017-02-11 DIAGNOSIS — N898 Other specified noninflammatory disorders of vagina: Secondary | ICD-10-CM | POA: Insufficient documentation

## 2017-02-11 DIAGNOSIS — M419 Scoliosis, unspecified: Secondary | ICD-10-CM | POA: Diagnosis not present

## 2017-02-11 DIAGNOSIS — F419 Anxiety disorder, unspecified: Secondary | ICD-10-CM | POA: Diagnosis not present

## 2017-02-11 DIAGNOSIS — N3001 Acute cystitis with hematuria: Secondary | ICD-10-CM | POA: Insufficient documentation

## 2017-02-11 DIAGNOSIS — M797 Fibromyalgia: Secondary | ICD-10-CM | POA: Diagnosis not present

## 2017-02-11 DIAGNOSIS — F329 Major depressive disorder, single episode, unspecified: Secondary | ICD-10-CM | POA: Diagnosis not present

## 2017-02-11 DIAGNOSIS — Z90710 Acquired absence of both cervix and uterus: Secondary | ICD-10-CM | POA: Insufficient documentation

## 2017-02-11 LAB — POCT URINALYSIS DIPSTICK
Glucose, UA: NEGATIVE
NITRITE UA: NEGATIVE
Protein, UA: 100
Spec Grav, UA: >=1.03 — AB (ref 1.010–1.025)
UROBILINOGEN UA: 0.2 U/dL
pH, UA: 5.5 (ref 5.0–8.0)

## 2017-02-11 MED ORDER — FLUCONAZOLE 150 MG PO TABS: 150.0000 mg | ORAL_TABLET | Freq: Once | ORAL | 0 refills | Status: AC

## 2017-02-11 MED ORDER — CEFTRIAXONE SODIUM 1 G IJ SOLR
1.0000 g | Freq: Once | INTRAMUSCULAR | Status: AC
  Administered 2017-02-11: 1 g via INTRAMUSCULAR

## 2017-02-11 MED ORDER — DOXYCYCLINE HYCLATE 100 MG PO TABS: 100.0000 mg | ORAL_TABLET | Freq: Two times a day (BID) | ORAL | 0 refills | Status: AC

## 2017-02-11 NOTE — Patient Instructions (Signed)

## 2017-02-11 NOTE — Progress Notes (Signed)
yell 

## 2017-02-11 NOTE — Progress Notes (Signed)
Patient ID: Katelyn Wade, female   DOB: 10/16/1975, 41 y.o.   MRN: [id number]   Katelyn Wade, is a 41 y.o. female  WUJ:[id number]  [id number]:[id number]  DOB - 1976-01-09  Subjective:  Chief Complaint and HPI: Katelyn Wade is a 41 y.o. female here today with dysuria and frequency after sexual contact with new partner.  Condom broke.  She has had a hysterectomy.  She is having vaginal discharge.  No abdominal/pelvic pain.  No f/c.  She wants to do bloodwork STI testing  ROS:   Constitutional:  No f/c, No night sweats, No unexplained weight loss. EENT:  No vision changes, No blurry vision, No hearing changes. No mouth, throat, or ear problems.  Respiratory: No cough, No SOB Cardiac: No CP, no palpitations GI:  No abd pain, No N/V/D. GU: +Urinary s/sx Musculoskeletal: No joint pain Neuro: No headache, no dizziness, no motor weakness.  Skin: No rash Endocrine:  No polydipsia. No polyuria.  Psych: Denies SI/HI  No problems updated.  ALLERGIES: No Known Allergies  PAST MEDICAL HISTORY: Past Medical History:  Diagnosis Date  . Anxiety   . Back pain   . Depression   . Fibromyalgia   . Scoliosis     MEDICATIONS AT HOME: Prior to Admission medications   Medication Sig Start Date End Date Taking? Authorizing Provider  doxycycline (VIBRA-TABS) 100 MG tablet Take 1 tablet (100 mg total) by mouth 2 (two) times daily. 02/11/17   Anders Simmonds, PA-C  fluconazole (DIFLUCAN) 150 MG tablet Take 1 tablet (150 mg total) by mouth once. 02/11/17 02/11/17  Anders Simmonds, PA-C  fluticasone (FLONASE) 50 MCG/ACT nasal spray Place 2 sprays into both nostrils daily. 11/02/16   Pete Glatter, MD  loratadine (CLARITIN) 10 MG tablet Take 1 tablet (10 mg total) by mouth daily. 11/02/16   Pete Glatter, MD  Milnacipran HCl (SAVELLA) 12.5 MG TABS Take 1 tablet (12.5 mg total) by mouth daily. 12/15/16   York Spaniel, MD     Objective:  EXAM:   Vitals:   02/11/17 1143  BP: (!) 88/60    Pulse: 80  Resp: 18  Temp: 98.2 F (36.8 C)  TempSrc: Oral  SpO2: 99%  Weight: 176 lb (79.8 kg)  Height: 5\' 2"  (1.575 m)    General appearance : A&OX3. NAD. Non-toxic-appearing HEENT: Atraumatic and Normocephalic.  PERRLA. EOM intact.  TM clear B. Neck: supple, no JVD. No cervical lymphadenopathy. No thyromegaly Chest/Lungs:  Breathing-non-labored, Good air entry bilaterally, breath sounds normal without rales, rhonchi, or wheezing  CVS: S1 S2 regular, no murmurs, gallops, rubs  Abdomen: Bowel sounds present, Non tender and not distended with no gaurding, rigidity or rebound. Vaginal: +normal rugation with pink mucosa.  There is whitish vaginal discharge.  Swabs taken.  Bimanual exam unremarkable.   Extremities: Bilateral Lower Ext shows no edema, both legs are warm to touch with = pulse throughout Neurology:  CN II-XII grossly intact, Non focal.   Psych:  TP linear. J/I WNL. Normal speech. Appropriate eye contact and affect.  Skin:  No Rash  Data Review Lab Results  Component Value Date   HGBA1C 5.1 07/07/2016   HGBA1C 5.2 11/01/2013     Assessment & Plan   1. Acute cystitis with hematuria Cover for GC/chlamydia since this is a new partner - Cervicovaginal ancillary only - Urine Culture - cefTRIAXone (ROCEPHIN) injection 1 g; Inject 1 g into the muscle once. - doxycycline (VIBRA-TABS) 100 MG tablet; Take 1 tablet (100 mg  total) by mouth 2 (two) times daily.  Dispense: 20 tablet; Refill: 0  2. Vaginal irritation - Urinalysis Dipstick - RPR - HIV antibody (with reflex) - fluconazole (DIFLUCAN) 150 MG tablet; Take 1 tablet (150 mg total) by mouth once.  Dispense: 1 tablet; Refill: 0  Patient have been counseled extensively about nutrition and exercise  Return in about 3 months (around 05/14/2017) for assign new PCP.  The patient was given clear instructions to go to ER or return to medical center if symptoms don't improve, worsen or new problems develop. The patient  verbalized understanding. The patient was told to call to get lab results if they haven't heard anything in the next week.     Georgian Co, PA-C [facility] [place], [place] [phone]   02/11/2017, 12:05 PM

## 2017-02-12 LAB — CERVICOVAGINAL ANCILLARY ONLY
BACTERIAL VAGINITIS: POSITIVE — AB
Candida vaginitis: NEGATIVE
Chlamydia: NEGATIVE
Neisseria Gonorrhea: NEGATIVE
Trichomonas: NEGATIVE

## 2017-02-12 LAB — HIV ANTIBODY (ROUTINE TESTING W REFLEX): HIV SCREEN 4TH GENERATION: NONREACTIVE

## 2017-02-12 LAB — RPR: RPR Ser Ql: NONREACTIVE

## 2017-02-15 LAB — URINE CULTURE

## 2017-02-17 ENCOUNTER — Other Ambulatory Visit: Payer: Self-pay | Admitting: Physician Assistant

## 2017-02-17 MED ORDER — METRONIDAZOLE 500 MG PO TABS: 500.0000 mg | ORAL_TABLET | Freq: Two times a day (BID) | ORAL | 0 refills | Status: AC

## 2017-02-18 ENCOUNTER — Telehealth: Payer: Self-pay | Admitting: *Deleted

## 2017-02-18 NOTE — Telephone Encounter (Signed)
Patient verified DOB Patient is aware of NO STD being noted. Injection and Flagyl will clear the patients BV. Patient is advised to stop doxycycline. No further questions at this time.

## 2017-02-18 NOTE — Telephone Encounter (Signed)
-----   Message from Anders SimmondsAngela M McClung, [place] sent at 02/17/2017  9:00 AM EDT ----- Please call patient.  Urine culture did show infection.  The injection we gave her should take care of it.  She can stop the doxycycline.  She tested positive for BV and I am sending her a prescription.  No STD.  Thanks, Georgian CoAngela McClung, PA-C

## 2017-04-20 ENCOUNTER — Ambulatory Visit: Payer: Medicaid Other | Admitting: Adult Health

## 2017-04-20 ENCOUNTER — Telehealth: Payer: Self-pay | Admitting: *Deleted

## 2017-04-20 NOTE — Telephone Encounter (Signed)
Patient was no show for follow up with Dolores HooseM [place], NP today.

## 2017-04-21 ENCOUNTER — Encounter: Payer: Self-pay | Admitting: Adult Health

## 2017-05-19 IMAGING — MR MR LUMBAR SPINE W/O CM
4 of 5 series · 26 of 48 positions shown · non-contrast
Comparison: 10/22/2015 lumbar radiographs

CLINICAL DATA: 40 y/o F; lower back pain with left leg
radiculopathy.

EXAM:
MRI LUMBAR SPINE WITHOUT CONTRAST
TECHNIQUE: Multiplanar, multisequence MR imaging of the lumbar spine was
performed. No intravenous contrast was administered.

[Series 3: T2 post-contrast · sagittal · 4.0mm · 0.55mm/px · 6 of 13 slices shown]
[im 1/13]
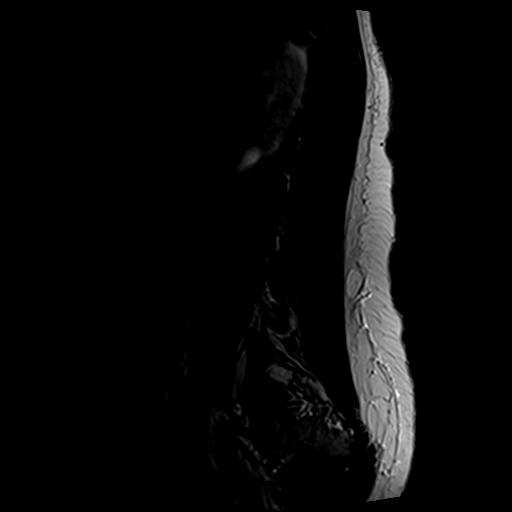
[im 3/13]
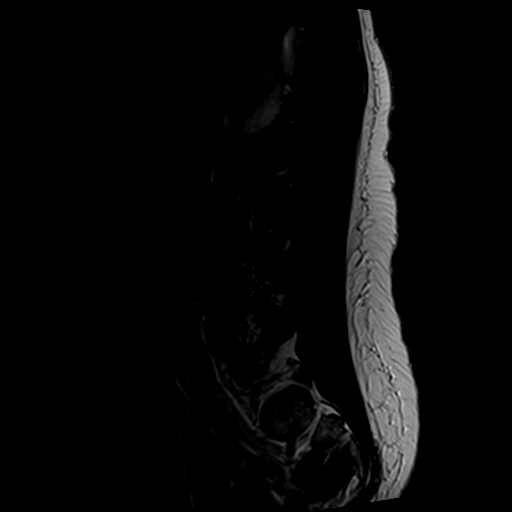
[im 5/13]
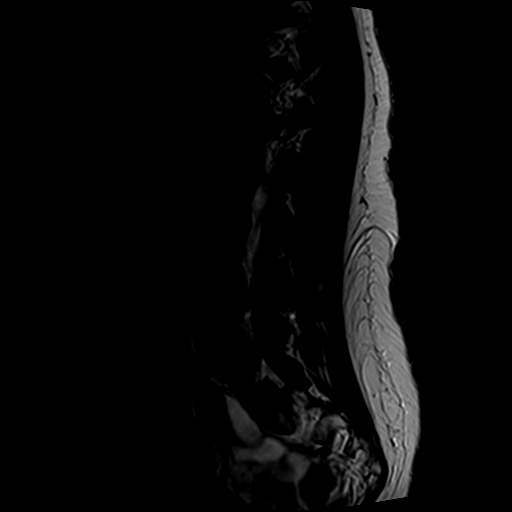
[im 8/13]
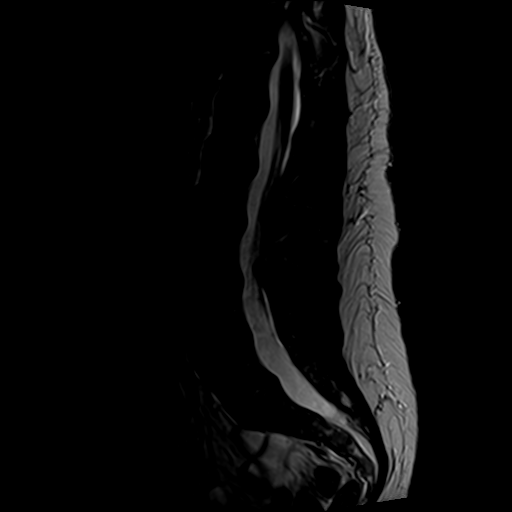
[im 10/13]
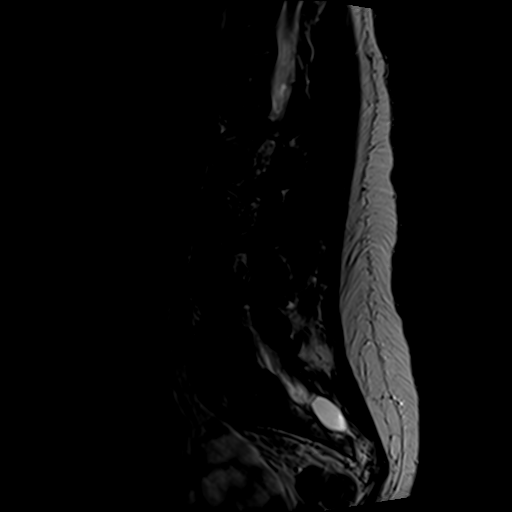
[im 13/13]
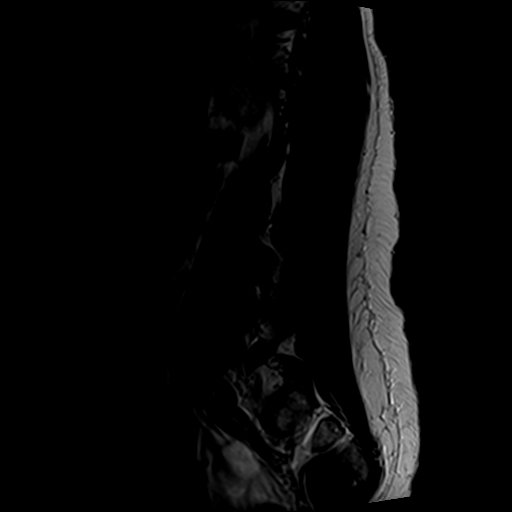

[Series 5: T1 · sagittal · 4.0mm · 0.55mm/px · 6 of 13 slices shown (1 of 2)]
[im 1/13]
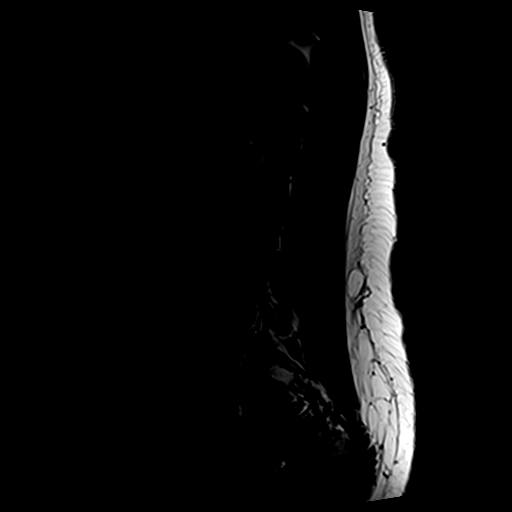
[im 3/13]
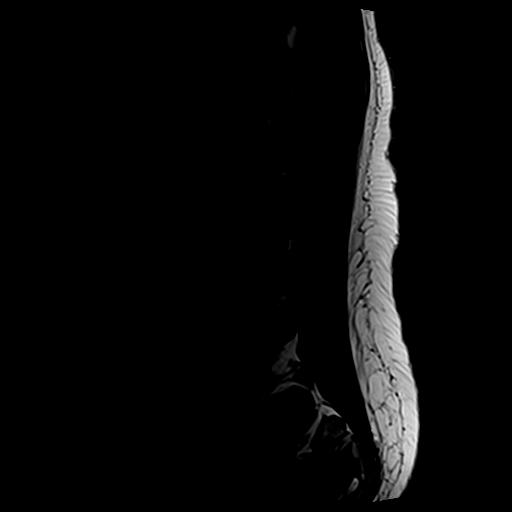
[im 5/13]
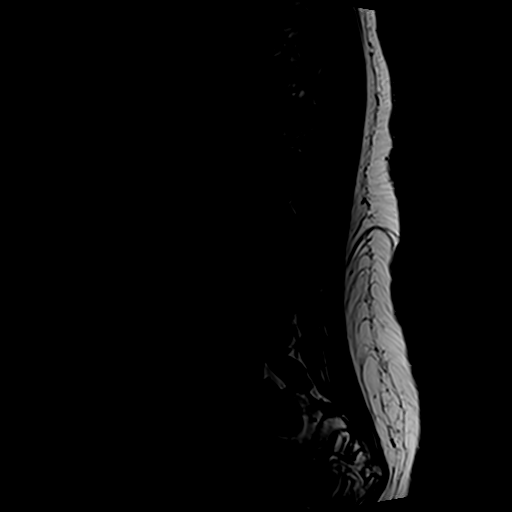
[im 8/13]
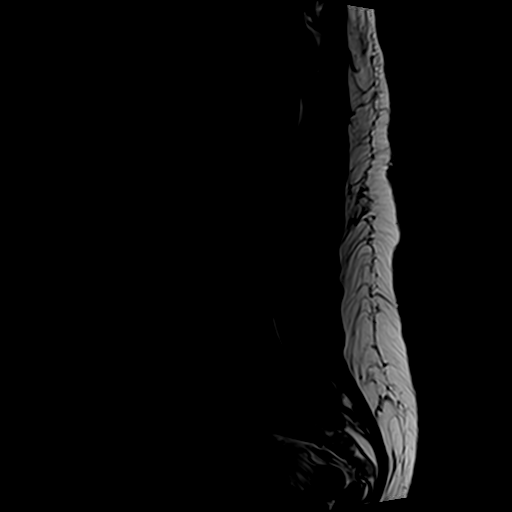
[im 10/13]
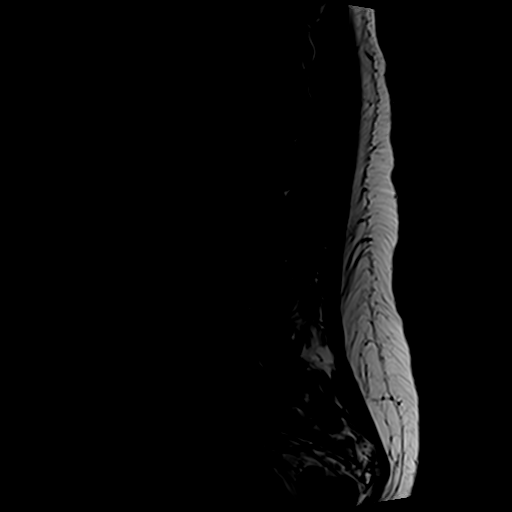
[im 13/13]
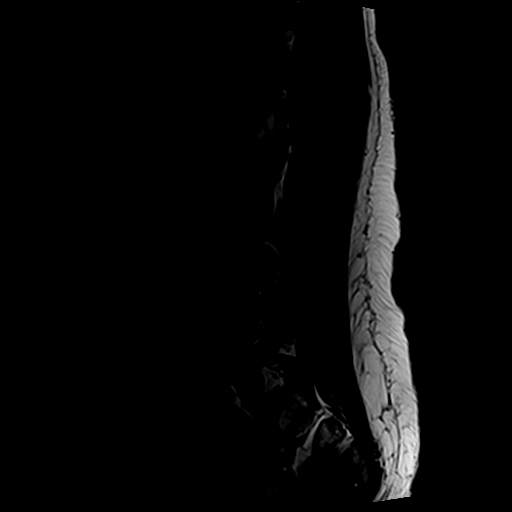

[Series 6: T1 · axial · 4.0mm · 0.35mm/px · z∈[-89,+52]mm · 5 of 30 slices shown (2 of 2)]
[im 1/30]
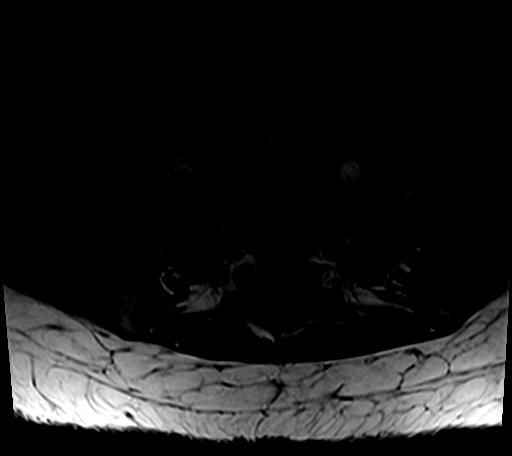
[im 5/30]
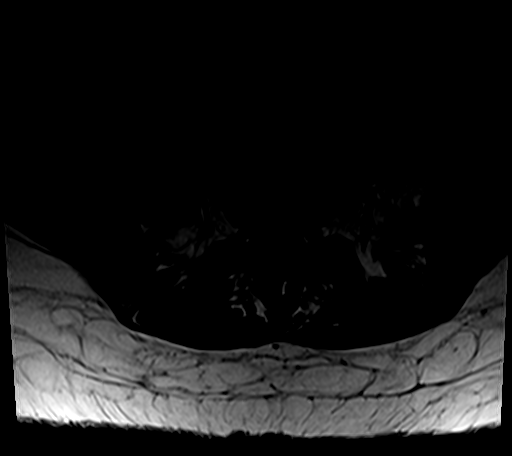
[im 9/30]
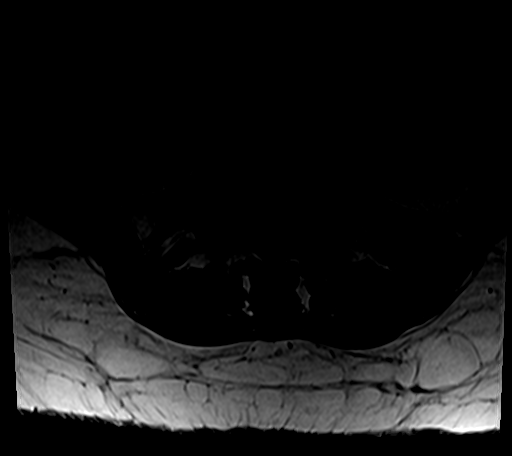
[im 15/30]
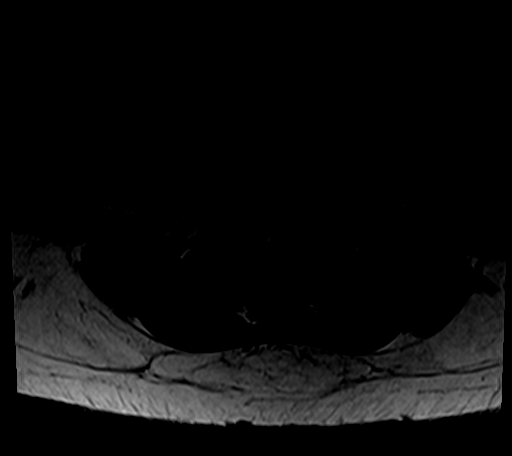
[im 25/30]
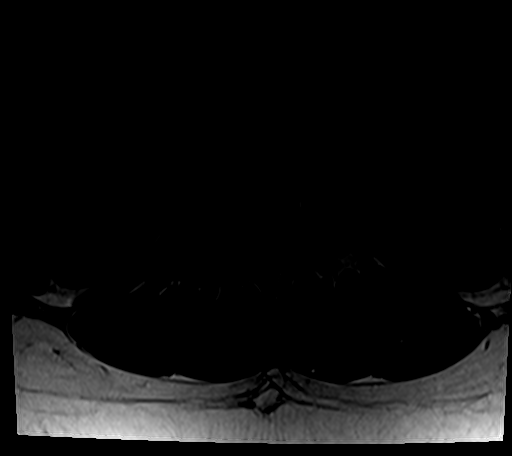

[Series 7: T2 · axial · 4.0mm · 0.70mm/px · z∈[-89,+77]mm · 9 of 30 slices shown]
[im 1/30]
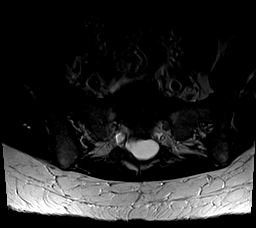
[im 5/30]
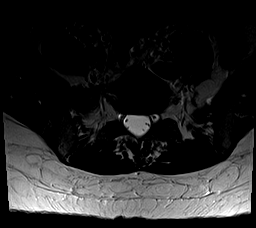
[im 9/30]
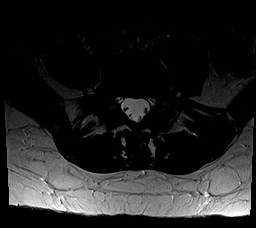
[im 13/30]
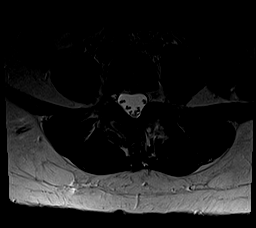
[im 15/30]
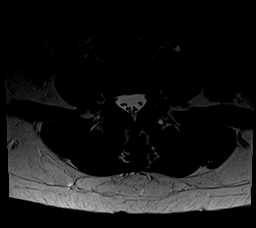
[im 17/30]
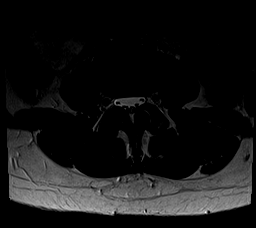
[im 21/30]
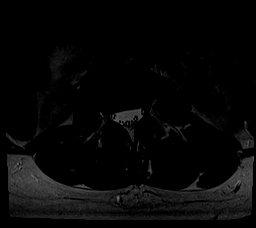
[im 25/30]
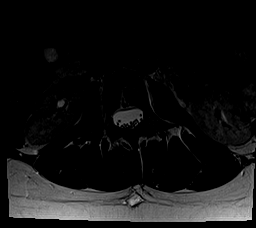
[im 30/30]
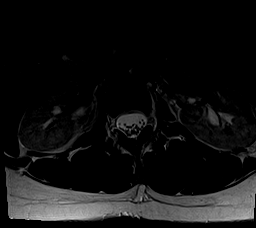

[26 of 48 positions shown; findings below may reference images not displayed]

FINDINGS: Segmentation:  Standard.

Alignment: Mild dextrocurvature with apex at L3. Normal lumbar
lordosis without listhesis.

Vertebrae:  No fracture, evidence of discitis, or bone lesion.

Conus medullaris: Extends to the L1-2 level and appears normal. Mild
dural ectasia extending into the sacrum.

Paraspinal and other soft tissues: Negative.

Disc levels:

L1-2: No significant disc displacement, foraminal narrowing, or
canal stenosis.

L2-3: Minimal disc bulge eccentric to the right. Mild facet
degenerative changes. No significant foraminal narrowing or canal
stenosis.

L3-4: Small disc bulge and mild facet hypertrophy. No significant
foraminal narrowing or canal stenosis. 3 mm synovial cyst arising
from left facet extending into left paraspinal muscles.

L4-5: Small disc bulge and mild bilateral facet hypertrophy. No
significant foraminal narrowing or canal stenosis.

L5-S1: Minimal disc bulge. No significant foraminal narrowing or
canal stenosis.
IMPRESSION: Mild lumbar dextrocurvature and spondylosis with predominantly facet
degenerative changes at the L3-4 and L4-5 levels. No significant
foraminal narrowing, canal stenosis, or evidence for neural
impingement. No acute osseous abnormality is evident.

By: Akilas Louisse M.D.

## 2024-06-02 ENCOUNTER — Ambulatory Visit: Payer: Self-pay | Admitting: Sports Medicine

## 2024-07-14 ENCOUNTER — Other Ambulatory Visit: Payer: Self-pay | Admitting: Nurse Practitioner

## 2024-07-14 DIAGNOSIS — Z1231 Encounter for screening mammogram for malignant neoplasm of breast: Secondary | ICD-10-CM

## 2024-07-18 ENCOUNTER — Other Ambulatory Visit: Payer: Self-pay | Admitting: Nurse Practitioner

## 2024-07-18 DIAGNOSIS — G8929 Other chronic pain: Secondary | ICD-10-CM

## 2024-07-19 ENCOUNTER — Other Ambulatory Visit: Payer: Self-pay | Admitting: Nurse Practitioner

## 2024-07-19 DIAGNOSIS — N644 Mastodynia: Secondary | ICD-10-CM

## 2024-07-19 DIAGNOSIS — R29898 Other symptoms and signs involving the musculoskeletal system: Secondary | ICD-10-CM

## 2024-07-19 DIAGNOSIS — M545 Low back pain, unspecified: Secondary | ICD-10-CM

## 2024-07-19 DIAGNOSIS — G8929 Other chronic pain: Secondary | ICD-10-CM

## 2024-07-26 ENCOUNTER — Other Ambulatory Visit: Payer: Self-pay | Admitting: Nurse Practitioner

## 2024-07-26 DIAGNOSIS — N644 Mastodynia: Secondary | ICD-10-CM

## 2024-08-04 ENCOUNTER — Other Ambulatory Visit

## 2024-08-04 ENCOUNTER — Encounter

## 2024-08-14 ENCOUNTER — Other Ambulatory Visit
